# Patient Record
Sex: Female | Born: 1950 | Race: White | Hispanic: No | Marital: Married | Smoking: Current every day smoker
Health system: Southern US, Community
[De-identification: ages and names within clinical notes are randomized; demographics above are authoritative.]

## PROBLEM LIST (undated history)

## (undated) DIAGNOSIS — E7800 Pure hypercholesterolemia, unspecified: Secondary | ICD-10-CM

## (undated) DIAGNOSIS — J449 Chronic obstructive pulmonary disease, unspecified: Secondary | ICD-10-CM

## (undated) DIAGNOSIS — F4310 Post-traumatic stress disorder, unspecified: Secondary | ICD-10-CM

## (undated) DIAGNOSIS — I10 Essential (primary) hypertension: Secondary | ICD-10-CM

## (undated) DIAGNOSIS — F329 Major depressive disorder, single episode, unspecified: Secondary | ICD-10-CM

## (undated) DIAGNOSIS — E119 Type 2 diabetes mellitus without complications: Secondary | ICD-10-CM

## (undated) DIAGNOSIS — E785 Hyperlipidemia, unspecified: Secondary | ICD-10-CM

## (undated) DIAGNOSIS — D649 Anemia, unspecified: Secondary | ICD-10-CM

## (undated) DIAGNOSIS — K219 Gastro-esophageal reflux disease without esophagitis: Secondary | ICD-10-CM

## (undated) DIAGNOSIS — F32A Depression, unspecified: Secondary | ICD-10-CM

## (undated) DIAGNOSIS — M1990 Unspecified osteoarthritis, unspecified site: Secondary | ICD-10-CM

## (undated) DIAGNOSIS — I509 Heart failure, unspecified: Secondary | ICD-10-CM

## (undated) HISTORY — PX: CARPAL TUNNEL RELEASE: SHX101

## (undated) HISTORY — PX: KNEE SURGERY: SHX244

## (undated) HISTORY — DX: Type 2 diabetes mellitus without complications: E11.9

## (undated) HISTORY — DX: Post-traumatic stress disorder, unspecified: F43.10

## (undated) HISTORY — DX: Hyperlipidemia, unspecified: E78.5

## (undated) HISTORY — PX: ULNAR NERVE TRANSPOSITION: SHX2595

## (undated) HISTORY — PX: WISDOM TOOTH EXTRACTION: SHX21

---

## 1998-01-28 ENCOUNTER — Inpatient Hospital Stay (HOSPITAL_COMMUNITY): Admission: AD | Admit: 1998-01-28 | Discharge: 1998-01-31 | Payer: Self-pay | Admitting: Cardiology

## 1998-01-30 ENCOUNTER — Encounter: Payer: Self-pay | Admitting: Cardiology

## 2006-12-06 ENCOUNTER — Emergency Department (HOSPITAL_COMMUNITY): Admission: EM | Admit: 2006-12-06 | Discharge: 2006-12-06 | Payer: Self-pay | Admitting: Emergency Medicine

## 2009-09-19 ENCOUNTER — Ambulatory Visit (HOSPITAL_COMMUNITY): Admission: RE | Admit: 2009-09-19 | Discharge: 2009-09-19 | Payer: Self-pay | Admitting: Neurology

## 2010-04-21 ENCOUNTER — Encounter: Payer: Self-pay | Admitting: Neurology

## 2011-10-19 DIAGNOSIS — J4489 Other specified chronic obstructive pulmonary disease: Secondary | ICD-10-CM

## 2011-10-19 DIAGNOSIS — J449 Chronic obstructive pulmonary disease, unspecified: Secondary | ICD-10-CM

## 2015-07-18 ENCOUNTER — Encounter (HOSPITAL_COMMUNITY): Payer: Self-pay | Admitting: Emergency Medicine

## 2015-07-18 ENCOUNTER — Emergency Department (HOSPITAL_COMMUNITY): Payer: Medicaid Other

## 2015-07-18 ENCOUNTER — Inpatient Hospital Stay (HOSPITAL_COMMUNITY)
Admission: EM | Admit: 2015-07-18 | Discharge: 2015-07-20 | DRG: 812 | Disposition: A | Discharge status: Home / Self Care | Payer: Medicaid Other | Attending: Internal Medicine | Admitting: Internal Medicine

## 2015-07-18 DIAGNOSIS — F329 Major depressive disorder, single episode, unspecified: Secondary | ICD-10-CM | POA: Diagnosis present

## 2015-07-18 DIAGNOSIS — R609 Edema, unspecified: Secondary | ICD-10-CM

## 2015-07-18 DIAGNOSIS — E119 Type 2 diabetes mellitus without complications: Secondary | ICD-10-CM | POA: Diagnosis present

## 2015-07-18 DIAGNOSIS — E7800 Pure hypercholesterolemia, unspecified: Secondary | ICD-10-CM | POA: Diagnosis present

## 2015-07-18 DIAGNOSIS — I10 Essential (primary) hypertension: Secondary | ICD-10-CM | POA: Diagnosis present

## 2015-07-18 DIAGNOSIS — J441 Chronic obstructive pulmonary disease with (acute) exacerbation: Secondary | ICD-10-CM | POA: Diagnosis present

## 2015-07-18 DIAGNOSIS — Z720 Tobacco use: Secondary | ICD-10-CM | POA: Diagnosis present

## 2015-07-18 DIAGNOSIS — R0602 Shortness of breath: Secondary | ICD-10-CM | POA: Insufficient documentation

## 2015-07-18 DIAGNOSIS — D649 Anemia, unspecified: Secondary | ICD-10-CM | POA: Diagnosis present

## 2015-07-18 DIAGNOSIS — F32A Depression, unspecified: Secondary | ICD-10-CM | POA: Diagnosis present

## 2015-07-18 DIAGNOSIS — R0609 Other forms of dyspnea: Secondary | ICD-10-CM

## 2015-07-18 DIAGNOSIS — F17210 Nicotine dependence, cigarettes, uncomplicated: Secondary | ICD-10-CM | POA: Diagnosis present

## 2015-07-18 DIAGNOSIS — D581 Hereditary elliptocytosis: Principal | ICD-10-CM | POA: Diagnosis present

## 2015-07-18 DIAGNOSIS — E785 Hyperlipidemia, unspecified: Secondary | ICD-10-CM | POA: Diagnosis present

## 2015-07-18 DIAGNOSIS — E611 Iron deficiency: Secondary | ICD-10-CM | POA: Diagnosis present

## 2015-07-18 HISTORY — DX: Type 2 diabetes mellitus without complications: E11.9

## 2015-07-18 HISTORY — DX: Chronic obstructive pulmonary disease, unspecified: J44.9

## 2015-07-18 HISTORY — DX: Essential (primary) hypertension: I10

## 2015-07-18 HISTORY — DX: Heart failure, unspecified: I50.9

## 2015-07-18 HISTORY — DX: Pure hypercholesterolemia, unspecified: E78.00

## 2015-07-18 HISTORY — DX: Depression, unspecified: F32.A

## 2015-07-18 HISTORY — DX: Major depressive disorder, single episode, unspecified: F32.9

## 2015-07-18 LAB — CBC WITH DIFFERENTIAL/PLATELET
BASOS ABS: 0 10*3/uL (ref 0.0–0.1)
Basophils Relative: 0 %
Eosinophils Absolute: 0.1 10*3/uL (ref 0.0–0.7)
Eosinophils Relative: 1 %
HCT: 29.7 % — ABNORMAL LOW (ref 36.0–46.0)
HEMOGLOBIN: 7.9 g/dL — AB (ref 12.0–15.0)
LYMPHS PCT: 19 %
Lymphs Abs: 1.8 10*3/uL (ref 0.7–4.0)
MCH: 16.8 pg — ABNORMAL LOW (ref 26.0–34.0)
MCHC: 26.6 g/dL — AB (ref 30.0–36.0)
MCV: 63.1 fL — ABNORMAL LOW (ref 78.0–100.0)
Monocytes Absolute: 0.6 10*3/uL (ref 0.1–1.0)
Monocytes Relative: 7 %
NEUTROS ABS: 6.6 10*3/uL (ref 1.7–7.7)
Neutrophils Relative %: 73 %
Platelets: 273 10*3/uL (ref 150–400)
RBC: 4.71 MIL/uL (ref 3.87–5.11)
RDW: 19.7 % — ABNORMAL HIGH (ref 11.5–15.5)
SMEAR REVIEW: ADEQUATE
WBC: 9.1 10*3/uL (ref 4.0–10.5)

## 2015-07-18 LAB — BASIC METABOLIC PANEL
Anion gap: 9 (ref 5–15)
BUN: 6 mg/dL (ref 6–20)
CHLORIDE: 105 mmol/L (ref 101–111)
CO2: 27 mmol/L (ref 22–32)
CREATININE: 0.43 mg/dL — AB (ref 0.44–1.00)
Calcium: 9 mg/dL (ref 8.9–10.3)
Glucose, Bld: 137 mg/dL — ABNORMAL HIGH (ref 65–99)
POTASSIUM: 3.5 mmol/L (ref 3.5–5.1)
SODIUM: 141 mmol/L (ref 135–145)

## 2015-07-18 LAB — TROPONIN I

## 2015-07-18 LAB — BRAIN NATRIURETIC PEPTIDE: B NATRIURETIC PEPTIDE 5: 83 pg/mL (ref 0.0–100.0)

## 2015-07-18 MED ORDER — METHYLPREDNISOLONE SODIUM SUCC 125 MG IJ SOLR
125.0000 mg | Freq: Once | INTRAMUSCULAR | Status: AC
  Administered 2015-07-18: 125 mg via INTRAVENOUS
  Filled 2015-07-18: qty 2

## 2015-07-18 MED ORDER — IPRATROPIUM-ALBUTEROL 0.5-2.5 (3) MG/3ML IN SOLN
3.0000 mL | Freq: Once | RESPIRATORY_TRACT | Status: AC
  Administered 2015-07-18: 3 mL via RESPIRATORY_TRACT
  Filled 2015-07-18: qty 3

## 2015-07-18 NOTE — ED Notes (Signed)
Pt ambulated in ER and 02 stayed around 95% on room air. Resp was 28, but pt was asymptomatic.

## 2015-07-18 NOTE — ED Notes (Signed)
Pt c/o increased sob and swelling of the lower legs x one month.

## 2015-07-18 NOTE — ED Notes (Signed)
Pt with SOB and swelling to le's, pt states hx of CHF

## 2015-07-18 NOTE — ED Provider Notes (Signed)
By signing my name below, I, Linus GalasMaharshi Patel, attest that this documentation has been prepared under the direction and in the presence of Enbridge EnergyKristen N Ward, DO. Electronically Signed: Linus GalasMaharshi Patel, ED Scribe. 07/18/2015. 11:27 PM.  TIME SEEN: 11:20 PM  CHIEF COMPLAINT:  Chief Complaint  Patient presents with  . Shortness of Breath   HPI:  HPI Comments: Katherine Romero is a 65 y.o. female with history of COPD, hypertension, diabetes, hyperlipidemia and reported history of CHF who presents to the Emergency Department complaining of SOB for the past 3 weeks. Pt also reports bilateral pedal swelling and productive cough with clear to white sputum. Pt states she has not had health insurance for the past month which caused her to stop taking Lasix. She states her PCP is aware of this but did not give her a new prescription or any other medications. She states her primary care physician told her she no longer needed Lasix. She denies to me that she has ever had an echocardiogram. She is not sure how she was diagnosed with CHF. Reports also a history of CAD but no stents. Last stress test we have in our system was in 1999 with an ejection fraction of 79%. She denies any chest pain or chest discomfort. States she feels that the swelling in her legs has improved. She states she is short of breath mostly with exertion. She does not wear oxygen at home. Pt denies any other symptoms at this time.   Current smoker.  Pts PCP is Dr. Sherryll BurgerShah  ROS: See HPI Constitutional: no fever  Eyes: no drainage  ENT: no runny nose   Cardiovascular:  leg swelling, no chest pain  Resp: SOB, cough GI: no vomiting GU: no dysuria Integumentary: no rash  Allergy: no hives  Musculoskeletal: no leg swelling  Neurological: no slurred speech ROS otherwise negative  PAST MEDICAL HISTORY/PAST SURGICAL HISTORY:  Past Medical History  Diagnosis Date  . COPD (chronic obstructive pulmonary disease) (HCC)   . Hypertension   .  Diabetes mellitus without complication (HCC)   . Hypercholesteremia   . CHF (congestive heart failure) (HCC)   . Depression     MEDICATIONS:  Prior to Admission medications   Not on File    ALLERGIES:  Allergies not on file  SOCIAL HISTORY:  Social History  Substance Use Topics  . Smoking status: Current Every Day Smoker    Types: Cigarettes  . Smokeless tobacco: Not on file  . Alcohol Use: No    FAMILY HISTORY: No family history on file.  EXAM: BP 187/79 mmHg  Pulse 94  Temp(Src) 99.1 F (37.3 C) (Temporal)  Resp 24  Ht 5\' 1"  (1.549 m)  Wt 149 lb (67.586 kg)  BMI 28.17 kg/m2  SpO2 97%   CONSTITUTIONAL: Alert and oriented and responds appropriately to questions. Elderly, well-nourished, afebrile and nontoxic, oral temperature is 98.1, and no distress HEAD: Normocephalic EYES: Conjunctivae clear, PERRL ENT: normal nose; no rhinorrhea; moist mucous membranes NECK: Supple, no meningismus, no LAD  CARD: RRR; S1 and S2 appreciated; no murmurs, no clicks, no rubs, no gallops RESP: Normal chest excursion without splinting or tachypnea; breath sounds clear and equal bilaterally; no wheezes, no rhonchi, no rales, no hypoxia or respiratory distress, speaking full sentences, slightly diminished at the bases bilaterally ABD/GI: Normal bowel sounds; non-distended; soft, non-tender, no rebound, no guarding, no peritoneal signs RECTAL:  Normal rectal tone, no gross blood or melena, guaiac negative, no hemorrhoids appreciated, nontender rectal exam BACK:  The back appears normal and is non-tender to palpation, there is no CVA tenderness EXT: Normal ROM in all joints; non-tender to palpation; very mild non pitting bilateral pedal edema; normal capillary refill; no cyanosis, no calf tenderness or swelling    SKIN: Normal color for age and race; warm; no rash NEURO: Moves all extremities equally, sensation to light touch intact diffusely, cranial nerves II through XII intact PSYCH: The  patient's mood and manner are appropriate. Grooming and personal hygiene are appropriate.  MEDICAL DECISION MAKING: Patient here shortness of breath. This may be from her continued tobacco use and COPD. She is diminished at her bases. We'll give DuoNeb, steroids and reassess. We'll ambulate patient in the emergency department. She denies any chest pain. Her EKG shows no ischemic abnormality. She does not appear significantly volume overloaded.  ED PROGRESS: Patient's labs show normal troponin and normal BNP. Chest x-ray is clear. She reports feeling better after DuoNeb nebs, steroids. She was able to ambulate around the emergency department her oxygen did not drop below 95%. Hemoglobin however is 7.9. We have no previous to compare this to. She is not sure what her normal hemoglobin runs. She states she has had some black stools intermittently but normally after taking Pepto-Bismol. No bloody stool or melena now. No vomiting. No vaginal bleeding. Has had a previous colonoscopy many years ago. No history of endoscopy. He is not on anticoagulation. She is Hemoccult negative today. I feel that her anemia may be what is making her symptomatic. We'll give her 1 unit of packed red blood cells and admit patient to observation. She has never had a blood transfusion before. She is comfortable with this plan. Family also at bedside.  1:00 AM  Discussed patient's case with hospitalist, Dr. Antionette Char.  Recommend admission to telemetry, observation bed.  I will place holding orders per their request. Patient and family (if present) updated with plan. Care transferred to hospitalist service.  I reviewed all nursing notes, vitals, pertinent old records, EKGs, labs, imaging (as available).    EKG Interpretation  Date/Time:  Wednesday July 18 2015 22:28:41 EDT Ventricular Rate:  97 PR Interval:  149 QRS Duration: 104 QT Interval:  374 QTC Calculation: 475 R Axis:   96 Text Interpretation:  Sinus rhythm Low voltage  with right axis deviation Baseline wander in lead(s) III aVL aVF No significant change since last tracing in 1999 Confirmed by WARD,  DO, KRISTEN [phone]) on 07/18/2015 11:07:18 PM        CRITICAL CARE Performed by: Raelyn Number   Total critical care time: 35 minutes - symptomatic anemia requiring blood transfusion  Critical care time was exclusive of separately billable procedures and treating other patients.  Critical care was necessary to treat or prevent imminent or life-threatening deterioration.  Critical care was time spent personally by me on the following activities: development of treatment plan with patient and/or surrogate as well as nursing, discussions with consultants, evaluation of patient's response to treatment, examination of patient, obtaining history from patient or surrogate, ordering and performing treatments and interventions, ordering and review of laboratory studies, ordering and review of radiographic studies, pulse oximetry and re-evaluation of patient's condition.    I personally performed the services described in this documentation, which was scribed in my presence. The recorded information has been reviewed and is accurate.    Layla Maw Ward, DO 07/19/15 0157

## 2015-07-19 ENCOUNTER — Encounter (HOSPITAL_COMMUNITY): Payer: Self-pay | Admitting: Family Medicine

## 2015-07-19 DIAGNOSIS — D649 Anemia, unspecified: Secondary | ICD-10-CM | POA: Diagnosis present

## 2015-07-19 DIAGNOSIS — J441 Chronic obstructive pulmonary disease with (acute) exacerbation: Secondary | ICD-10-CM | POA: Diagnosis present

## 2015-07-19 DIAGNOSIS — Z720 Tobacco use: Secondary | ICD-10-CM

## 2015-07-19 DIAGNOSIS — R0609 Other forms of dyspnea: Secondary | ICD-10-CM

## 2015-07-19 DIAGNOSIS — D581 Hereditary elliptocytosis: Secondary | ICD-10-CM

## 2015-07-19 DIAGNOSIS — R0600 Dyspnea, unspecified: Secondary | ICD-10-CM | POA: Diagnosis present

## 2015-07-19 DIAGNOSIS — F17210 Nicotine dependence, cigarettes, uncomplicated: Secondary | ICD-10-CM | POA: Diagnosis present

## 2015-07-19 DIAGNOSIS — F329 Major depressive disorder, single episode, unspecified: Secondary | ICD-10-CM | POA: Diagnosis present

## 2015-07-19 DIAGNOSIS — E785 Hyperlipidemia, unspecified: Secondary | ICD-10-CM | POA: Diagnosis present

## 2015-07-19 DIAGNOSIS — I10 Essential (primary) hypertension: Secondary | ICD-10-CM | POA: Diagnosis present

## 2015-07-19 DIAGNOSIS — F32A Depression, unspecified: Secondary | ICD-10-CM | POA: Diagnosis present

## 2015-07-19 DIAGNOSIS — E7800 Pure hypercholesterolemia, unspecified: Secondary | ICD-10-CM | POA: Diagnosis present

## 2015-07-19 DIAGNOSIS — E119 Type 2 diabetes mellitus without complications: Secondary | ICD-10-CM | POA: Diagnosis present

## 2015-07-19 DIAGNOSIS — E611 Iron deficiency: Secondary | ICD-10-CM | POA: Diagnosis present

## 2015-07-19 HISTORY — DX: Hereditary elliptocytosis: D58.1

## 2015-07-19 HISTORY — DX: Tobacco use: Z72.0

## 2015-07-19 HISTORY — DX: Chronic obstructive pulmonary disease with (acute) exacerbation: J44.1

## 2015-07-19 LAB — FERRITIN: Ferritin: 4 ng/mL — ABNORMAL LOW (ref 11–307)

## 2015-07-19 LAB — CBC WITH DIFFERENTIAL/PLATELET
BASOS ABS: 0 10*3/uL (ref 0.0–0.1)
Basophils Relative: 0 %
EOS PCT: 0 %
Eosinophils Absolute: 0 10*3/uL (ref 0.0–0.7)
HCT: 25.2 % — ABNORMAL LOW (ref 36.0–46.0)
HEMOGLOBIN: 6.5 g/dL — AB (ref 12.0–15.0)
LYMPHS PCT: 6 %
Lymphs Abs: 0.5 10*3/uL — ABNORMAL LOW (ref 0.7–4.0)
MCH: 16.3 pg — ABNORMAL LOW (ref 26.0–34.0)
MCHC: 25.8 g/dL — ABNORMAL LOW (ref 30.0–36.0)
MCV: 63.2 fL — AB (ref 78.0–100.0)
Monocytes Absolute: 0 10*3/uL — ABNORMAL LOW (ref 0.1–1.0)
Monocytes Relative: 0 %
NEUTROS PCT: 94 %
Neutro Abs: 8.6 10*3/uL — ABNORMAL HIGH (ref 1.7–7.7)
PLATELETS: 286 10*3/uL (ref 150–400)
RBC: 3.99 MIL/uL (ref 3.87–5.11)
RDW: 19.6 % — ABNORMAL HIGH (ref 11.5–15.5)
WBC: 9.2 10*3/uL (ref 4.0–10.5)

## 2015-07-19 LAB — BILIRUBIN, FRACTIONATED(TOT/DIR/INDIR): Total Bilirubin: 0.3 mg/dL (ref 0.3–1.2)

## 2015-07-19 LAB — CBC
HEMATOCRIT: 28.4 % — AB (ref 36.0–46.0)
Hemoglobin: 7 g/dL — ABNORMAL LOW (ref 12.0–15.0)
MCH: 16.4 pg — ABNORMAL LOW (ref 26.0–34.0)
MCHC: 24.6 g/dL — ABNORMAL LOW (ref 30.0–36.0)
MCV: 66.7 fL — AB (ref 78.0–100.0)
Platelets: 350 10*3/uL (ref 150–400)
RBC: 4.26 MIL/uL (ref 3.87–5.11)
RDW: 20.4 % — AB (ref 11.5–15.5)
WBC: 11 10*3/uL — AB (ref 4.0–10.5)

## 2015-07-19 LAB — BASIC METABOLIC PANEL
ANION GAP: 10 (ref 5–15)
BUN: 8 mg/dL (ref 6–20)
CALCIUM: 9.1 mg/dL (ref 8.9–10.3)
CO2: 25 mmol/L (ref 22–32)
Chloride: 105 mmol/L (ref 101–111)
Creatinine, Ser: 0.51 mg/dL (ref 0.44–1.00)
GFR calc Af Amer: >60 mL/min (ref >60)
GFR calc non Af Amer: >60 mL/min (ref >60)
GLUCOSE: 189 mg/dL — AB (ref 65–99)
Potassium: 3.6 mmol/L (ref 3.5–5.1)
Sodium: 140 mmol/L (ref 135–145)

## 2015-07-19 LAB — URINALYSIS, ROUTINE W REFLEX MICROSCOPIC
BILIRUBIN URINE: NEGATIVE
GLUCOSE, UA: 100 mg/dL — AB
HGB URINE DIPSTICK: NEGATIVE
Ketones, ur: NEGATIVE mg/dL
Leukocytes, UA: NEGATIVE
Nitrite: NEGATIVE
PH: 6.5 (ref 5.0–8.0)
Protein, ur: NEGATIVE mg/dL

## 2015-07-19 LAB — IRON AND TIBC
Iron: 21 ug/dL — ABNORMAL LOW (ref 28–170)
Saturation Ratios: 4 % — ABNORMAL LOW (ref 10.4–31.8)
TIBC: 549 ug/dL — AB (ref 250–450)
UIBC: 528 ug/dL

## 2015-07-19 LAB — POC OCCULT BLOOD, ED: FECAL OCCULT BLD: NEGATIVE

## 2015-07-19 LAB — PREPARE RBC (CROSSMATCH)

## 2015-07-19 LAB — GLUCOSE, CAPILLARY
Glucose-Capillary: 275 mg/dL — ABNORMAL HIGH (ref 65–99)
Glucose-Capillary: 262 mg/dL — ABNORMAL HIGH (ref 65–99)
Glucose-Capillary: 407 mg/dL — ABNORMAL HIGH (ref 65–99)

## 2015-07-19 LAB — MAGNESIUM: Magnesium: 1.9 mg/dL (ref 1.7–2.4)

## 2015-07-19 LAB — VITAMIN B12: Vitamin B-12: 243 pg/mL (ref 180–914)

## 2015-07-19 LAB — PROTIME-INR
INR: 1.07 (ref 0.00–1.49)
Prothrombin Time: 14.1 seconds (ref 11.6–15.2)

## 2015-07-19 LAB — LACTATE DEHYDROGENASE: LDH: 190 U/L (ref 98–192)

## 2015-07-19 LAB — ABO/RH: ABO/RH(D): O NEG

## 2015-07-19 LAB — APTT: APTT: 30 s (ref 24–37)

## 2015-07-19 MED ORDER — PROMETHAZINE-CODEINE 6.25-10 MG/5ML PO SYRP
5.0000 mL | ORAL_SOLUTION | ORAL | Status: DC | PRN
  Administered 2015-07-19: 5 mL via ORAL
  Filled 2015-07-19: qty 5

## 2015-07-19 MED ORDER — INSULIN ASPART 100 UNIT/ML ~~LOC~~ SOLN
0.0000 [IU] | Freq: Three times a day (TID) | SUBCUTANEOUS | Status: DC
  Administered 2015-07-19 (×3): 5 [IU] via SUBCUTANEOUS
  Administered 2015-07-20: 3 [IU] via SUBCUTANEOUS
  Administered 2015-07-20: 7 [IU] via SUBCUTANEOUS

## 2015-07-19 MED ORDER — HYDROCODONE-ACETAMINOPHEN 5-325 MG PO TABS
1.0000 | ORAL_TABLET | ORAL | Status: DC | PRN
  Administered 2015-07-19: 1 via ORAL
  Filled 2015-07-19: qty 1

## 2015-07-19 MED ORDER — ONDANSETRON HCL 4 MG PO TABS: 4.0000 mg | ORAL_TABLET | Freq: Four times a day (QID) | ORAL | Status: DC | PRN

## 2015-07-19 MED ORDER — METHYLPREDNISOLONE SODIUM SUCC 125 MG IJ SOLR
60.0000 mg | Freq: Four times a day (QID) | INTRAMUSCULAR | Status: DC
  Administered 2015-07-19 – 2015-07-20 (×6): 60 mg via INTRAVENOUS
  Filled 2015-07-19 (×6): qty 2

## 2015-07-19 MED ORDER — ACETAMINOPHEN 325 MG PO TABS: 650.0000 mg | ORAL_TABLET | Freq: Four times a day (QID) | ORAL | Status: DC | PRN

## 2015-07-19 MED ORDER — SODIUM CHLORIDE 0.9 % IV SOLN
10.0000 mL/h | Freq: Once | INTRAVENOUS | Status: AC
  Administered 2015-07-19: 10 mL/h via INTRAVENOUS

## 2015-07-19 MED ORDER — SENNOSIDES-DOCUSATE SODIUM 8.6-50 MG PO TABS
1.0000 | ORAL_TABLET | Freq: Every day | ORAL | Status: DC
  Administered 2015-07-19: 1 via ORAL
  Filled 2015-07-19: qty 1

## 2015-07-19 MED ORDER — POTASSIUM CHLORIDE 10 MEQ/100ML IV SOLN
10.0000 meq | INTRAVENOUS | Status: AC
  Administered 2015-07-19 (×2): 10 meq via INTRAVENOUS
  Filled 2015-07-19 (×2): qty 100

## 2015-07-19 MED ORDER — NICOTINE 21 MG/24HR TD PT24
21.0000 mg | MEDICATED_PATCH | Freq: Every day | TRANSDERMAL | Status: DC
  Administered 2015-07-19 – 2015-07-20 (×2): 21 mg via TRANSDERMAL
  Filled 2015-07-19 (×2): qty 1

## 2015-07-19 MED ORDER — ONDANSETRON HCL 4 MG/2ML IJ SOLN: 4.0000 mg | Freq: Four times a day (QID) | INTRAMUSCULAR | Status: DC | PRN

## 2015-07-19 MED ORDER — IPRATROPIUM-ALBUTEROL 0.5-2.5 (3) MG/3ML IN SOLN: 3.0000 mL | RESPIRATORY_TRACT | Status: DC | PRN

## 2015-07-19 MED ORDER — INSULIN ASPART 100 UNIT/ML ~~LOC~~ SOLN
10.0000 [IU] | Freq: Once | SUBCUTANEOUS | Status: AC
  Administered 2015-07-19: 10 [IU] via SUBCUTANEOUS

## 2015-07-19 MED ORDER — SODIUM CHLORIDE 0.9% FLUSH
3.0000 mL | Freq: Two times a day (BID) | INTRAVENOUS | Status: DC
  Administered 2015-07-19 – 2015-07-20 (×4): 3 mL via INTRAVENOUS

## 2015-07-19 MED ORDER — HYDRALAZINE HCL 20 MG/ML IJ SOLN: 10.0000 mg | INTRAMUSCULAR | Status: DC | PRN

## 2015-07-19 MED ORDER — SODIUM CHLORIDE 0.9 % IV SOLN
Freq: Once | INTRAVENOUS | Status: AC
  Administered 2015-07-19: 02:00:00 via INTRAVENOUS

## 2015-07-19 MED ORDER — ACETAMINOPHEN 650 MG RE SUPP: 650.0000 mg | Freq: Four times a day (QID) | RECTAL | Status: DC | PRN

## 2015-07-19 NOTE — Progress Notes (Signed)
CRITICAL VALUE ALERT  Critical value received:  HgB = 6.5  Date of notification:  07/19/15  Time of notification:  0740  Critical value read back:Yes.    Nurse who received alert:  Antony OdeaJason Alden Bensinger  MD notified (1st page):  Dr. Roda ShuttersXU  Time of first page:  [phone]  MD notified (2nd page):  Time of second page:  Responding MD:    Time MD responded:

## 2015-07-19 NOTE — Progress Notes (Signed)
Pt had episode of SVT (200).  EKG taken showing SR. O2 @ 2L given.  Vitals B/P155/75, T97.5, 96%, 91P. Pt stated that the frequent coughing spells she had been experiencing made her heart race and made her SOB.  Md notified and ordered K+,  also cough med to suppress coughing.

## 2015-07-19 NOTE — Progress Notes (Addendum)
One unit of blood transfused, vital stable, repeat cbc/bmp pending, no sign of bleeding, anemia work up showed iron deficiency.  C/p right lower extremity edema, venous us ordered.

## 2015-07-19 NOTE — H&P (Signed)
History and Physical    Katherine Romero ZOX:[id number] DOB: [birth year] DOA: 07/18/2015  Referring Provider: Dr. Elesa Massed (EDP) PCP: Kirstie Peri, MD  Outpatient Specialists: None listed  Patient coming from: Home  Chief Complaint: Dyspnea  HPI: Katherine Romero is a 65 y.o. female with medical history significant for COPD, tobacco abuse, type 2 diabetes mellitus, and hypertension who presents to the ED with approximately 1 month of exertional dyspnea. Patient had previously been managed with Lasix, but this was discontinued approximately one month ago by her PCP who indicated that she does not need it. Patient has suspected that her progressive dyspnea over the past month his aunt results of the discontinued diuretic and likely pulmonary edema. She denies any fevers or chills over the past month and denies chest pain, palpitations, orthopnea, or PND. She has increased cough over the past month occasionally productive of thick white sputum. She continues to smoke cigarettes daily. She occasionally uses her home nebulizer unit which does help somewhat with the current symptoms. Patient denies abdominal pain, nausea, vomiting, or diarrhea. She denies melena or hematochezia. Patient also denies hematuria or vaginal bleeding.  ED Course: Upon arrival to the ED, patient is found to be afebrile, saturating adequately on room air while at rest, and with vital signs otherwise stable. Chest x-ray is notable for mild cardiac enlargement but negative for pulmonary edema or infiltrate. EKG features a sinus rhythm with low voltage QRS but no ischemic features. BNP is within the normal limits and troponin is undetectable. BMP is largely unremarkable and CBC is notable for hemoglobin of 7.9 with MCV of 63.1. Elliptocytes are noted on the CBC. Patient was treated with DuoNeb and Solu-Medrol 125 mg IV in the emergency department and enjoyed some improvement in her respiratory symptoms. She was ambulated about the emergency  department and, so saturating well, was significantly symptomatic with dyspnea. 1 unit of packed red blood cells has been ordered by the EDP for immediate transfusion. Patient will be admitted to the hospital for ongoing evaluation and management of progressive dyspnea suspected secondary to anemia and COPD with acute exacerbation.  Review of Systems:  All other systems reviewed and apart from HPI, are negative.  Past Medical History  Diagnosis Date  . COPD (chronic obstructive pulmonary disease) (HCC)   . Hypertension   . Diabetes mellitus without complication (HCC)   . Hypercholesteremia   . CHF (congestive heart failure) (HCC)   . Depression     Past Surgical History  Procedure Laterality Date  . Knee surgery    . Carpal tunnel release       reports that she has been smoking Cigarettes.  She does not have any smokeless tobacco history on file. She reports that she does not drink alcohol or use illicit drugs.  Allergies not on file  Family History  Problem Relation Age of Onset  . Anemia Son      Prior to Admission medications   Not on File    Physical Exam: Filed Vitals:   07/18/15 2330 07/18/15 2334 07/18/15 2345 07/19/15 0048  BP: 152/82   183/81  Pulse:   91 101  Temp:      TempSrc:      Resp: Height:      Weight:      SpO2:  98% 99% 97%      Constitutional: NAD, calm, comfortable Eyes: PERTLA, lids and conjunctivae normal ENMT: Mucous membranes are moist. Posterior pharynx clear of any exudate  or lesions. Normal dentition.  Neck: normal, supple, no masses, no thyromegaly Respiratory: clear to auscultation bilaterally, mildly diminished with scattered expiratory wheezes bilaterally, no crackles. Normal respiratory effort.    Cardiovascular: S1 & S2 heard, regular rate and rhythm, no murmurs / rubs / gallops. No extremity edema. 2+ pedal pulses.   Abdomen: No distension, no tenderness, no masses palpated. No hepatosplenomegaly. Bowel sounds  normal.  Musculoskeletal: no clubbing / cyanosis. No joint deformity upper and lower extremities. Good ROM, no contractures. Normal muscle tone.  Skin: no rashes, lesions, ulcers. No induration Neurologic: CN 2-12 grossly intact. Sensation intact, DTR normal. Strength 5/5 in all 4 limbs.  Psychiatric: Normal judgment and insight. Alert and oriented x 3. Normal mood.     Labs on Admission: I have personally reviewed following labs and imaging studies  CBC:  Recent Labs Lab 07/18/15 2330  WBC 9.1  NEUTROABS 6.6  HGB 7.9*  HCT 29.7*  MCV 63.1*  PLT 273   Basic Metabolic Panel:  Recent Labs Lab 07/18/15 2230  NA 141  K 3.5  CL 105  CO2 27  GLUCOSE 137*  BUN 6  CREATININE 0.43*  CALCIUM 9.0   GFR: Estimated Creatinine Clearance: 63.3 mL/min (by C-G formula based on Cr of 0.43). Liver Function Tests: No results for input(s): AST, ALT, ALKPHOS, BILITOT, PROT, ALBUMIN in the last 168 hours. No results for input(s): LIPASE, AMYLASE in the last 168 hours. No results for input(s): AMMONIA in the last 168 hours. Coagulation Profile:  Recent Labs Lab 07/18/15 2233  INR 1.07   Cardiac Enzymes:  Recent Labs Lab 07/18/15 2230  TROPONINI <0.03   BNP (last 3 results) No results for input(s): PROBNP in the last 8760 hours. HbA1C: No results for input(s): HGBA1C in the last 72 hours. CBG: No results for input(s): GLUCAP in the last 168 hours. Lipid Profile: No results for input(s): CHOL, HDL, LDLCALC, TRIG, CHOLHDL, LDLDIRECT in the last 72 hours. Thyroid Function Tests: No results for input(s): TSH, T4TOTAL, FREET4, T3FREE, THYROIDAB in the last 72 hours. Anemia Panel: No results for input(s): VITAMINB12, FOLATE, FERRITIN, TIBC, IRON, RETICCTPCT in the last 72 hours. Urine analysis: No results found for: COLORURINE, APPEARANCEUR, LABSPEC, PHURINE, GLUCOSEU, HGBUR, BILIRUBINUR, KETONESUR, PROTEINUR, UROBILINOGEN, NITRITE, LEUKOCYTESUR Sepsis  Labs: (procalcitonin:4,lacticidven:4) )No results found for this or any previous visit (from the past 240 hour(s)).   Radiological Exams on Admission: Dg Chest Portable 1 View  07/18/2015  CLINICAL DATA:  Shortness of breath for 1 week, worse tonight. Central chest pain and pain between the shoulder blades, onset today. Chronic cough. History of CHF, diabetes, hypertension, COPD. EXAM: PORTABLE CHEST 1 VIEW COMPARISON:  11/15/2014 FINDINGS: Mild cardiac enlargement. No pulmonary vascular congestion. No focal airspace disease or consolidation in the lungs. No blunting of costophrenic angles. No pneumothorax. Mediastinal contours appear intact. IMPRESSION: Mild cardiac enlargement.  No evidence of active pulmonary disease. Electronically Signed   By: Burman Nieves M.D.   On: 07/18/2015 22:46    EKG: Independently reviewed. Sinus rhythm, low-voltage QRS  Assessment/Plan  1. 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 COPD with acute exacerbation  - No supplemental O2 requirement  - Responding well to DuoNeb and steroid in the ED - Continue DuoNeb q4h prn, Solu-Medrol 60 mg IV q6h  - Monitor O2 sats and titrate FiO2 to keep sat >92%   3. Tobacco abuse - Counseled toward cessation  - RN to provide smoking cessation information prior to discharge     DVT prophylaxis: SCDs  Code Status: Full  Family Communication: Husband at the beside  Disposition Plan: Admit to telemetry  Consults called: None   Admission status: Observation    Briscoe Deutscherimothy S Rolin Schult MD [facility] Pager [phone]  If 7PM-7AM, please contact night-coverage [email or web address] Password [email or web address]  07/19/2015, 1:26 AM

## 2015-07-20 ENCOUNTER — Inpatient Hospital Stay (HOSPITAL_COMMUNITY): Payer: Medicaid Other

## 2015-07-20 DIAGNOSIS — Z720 Tobacco use: Secondary | ICD-10-CM

## 2015-07-20 DIAGNOSIS — J441 Chronic obstructive pulmonary disease with (acute) exacerbation: Secondary | ICD-10-CM

## 2015-07-20 DIAGNOSIS — D509 Iron deficiency anemia, unspecified: Secondary | ICD-10-CM

## 2015-07-20 DIAGNOSIS — D581 Hereditary elliptocytosis: Principal | ICD-10-CM

## 2015-07-20 LAB — CBC
HEMATOCRIT: 29.7 % — AB (ref 36.0–46.0)
HEMOGLOBIN: 8.2 g/dL — AB (ref 12.0–15.0)
MCH: 17.7 pg — ABNORMAL LOW (ref 26.0–34.0)
MCHC: 27.6 g/dL — ABNORMAL LOW (ref 30.0–36.0)
MCV: 64.1 fL — ABNORMAL LOW (ref 78.0–100.0)
Platelets: 386 10*3/uL (ref 150–400)
RBC: 4.63 MIL/uL (ref 3.87–5.11)
RDW: 21.1 % — ABNORMAL HIGH (ref 11.5–15.5)
WBC: 14 10*3/uL — AB (ref 4.0–10.5)

## 2015-07-20 LAB — BASIC METABOLIC PANEL
ANION GAP: 9 (ref 5–15)
BUN: 10 mg/dL (ref 6–20)
CHLORIDE: 105 mmol/L (ref 101–111)
CO2: 25 mmol/L (ref 22–32)
Calcium: 9.3 mg/dL (ref 8.9–10.3)
Creatinine, Ser: 0.5 mg/dL (ref 0.44–1.00)
GFR calc non Af Amer: >60 mL/min (ref >60)
Glucose, Bld: 230 mg/dL — ABNORMAL HIGH (ref 65–99)
POTASSIUM: 3.7 mmol/L (ref 3.5–5.1)
Sodium: 139 mmol/L (ref 135–145)

## 2015-07-20 LAB — GLUCOSE, CAPILLARY
GLUCOSE-CAPILLARY: 305 mg/dL — AB (ref 65–99)
Glucose-Capillary: 236 mg/dL — ABNORMAL HIGH (ref 65–99)

## 2015-07-20 LAB — FOLATE RBC
FOLATE, HEMOLYSATE: 321.7 ng/mL
Folate, RBC: 1324 ng/mL (ref >498)
HEMATOCRIT: 24.3 % — AB (ref 34.0–46.6)

## 2015-07-20 LAB — TYPE AND SCREEN
ABO/RH(D): O NEG
Antibody Screen: POSITIVE
DAT, IgG: NEGATIVE
Unit division: 0

## 2015-07-20 LAB — HEMOGLOBIN A1C
HEMOGLOBIN A1C: 6.3 % — AB (ref 4.8–5.6)
MEAN PLASMA GLUCOSE: 134 mg/dL

## 2015-07-20 LAB — HAPTOGLOBIN: Haptoglobin: 202 mg/dL — ABNORMAL HIGH (ref 34–200)

## 2015-07-20 MED ORDER — DOXYCYCLINE HYCLATE 100 MG PO CAPS: 100.0000 mg | ORAL_CAPSULE | Freq: Two times a day (BID) | ORAL | Status: DC

## 2015-07-20 MED ORDER — FERROUS SULFATE 325 (65 FE) MG PO TABS
325.0000 mg | ORAL_TABLET | ORAL | Status: DC
  Administered 2015-07-20: 325 mg via ORAL
  Filled 2015-07-20: qty 1

## 2015-07-20 MED ORDER — FERROUS SULFATE 325 (65 FE) MG PO TABS: 325.0000 mg | ORAL_TABLET | ORAL | Status: DC

## 2015-07-20 MED ORDER — SENNOSIDES-DOCUSATE SODIUM 8.6-50 MG PO TABS: 1.0000 | ORAL_TABLET | Freq: Every day | ORAL | Status: DC

## 2015-07-20 MED ORDER — PREDNISONE 10 MG PO TABS: ORAL_TABLET | ORAL | Status: DC

## 2015-07-20 NOTE — Care Management Note (Signed)
Case Management Note  Patient Details  Name: Katherine Romero MRN: [id number] Date of Birth: June 20, 1950  Subjective/Objective:   Spoke with patient for discharge planning. Patient is alert and orient from home , denies any needs. Stated that she can afford her medications without difficulty. No Cm needs identified.            Action/Plan:Home with self care.   Expected Discharge Date:  07/21/15               Expected Discharge Plan:  Home/Self Care  In-House Referral:     Discharge planning Services  CM Consult  Post Acute Care Choice:    Choice offered to:  NA  DME Arranged:  N/A DME Agency:  NA  HH Arranged:  NA HH Agency:     Status of Service:  Completed, signed off  Medicare Important Message Given:    Date Medicare IM Given:    Medicare IM give by:    Date Additional Medicare IM Given:    Additional Medicare Important Message give by:     If discussed at Long Length of Stay Meetings, dates discussed:    Additional Comments:  Adonis HugueninBerkhead, Aanya Haynes L, RN 07/20/2015, 12:14 PM

## 2015-07-20 NOTE — Progress Notes (Addendum)
Results for Joylene IgoUSTIN, Josalyn R (MRN [id number]) as of 07/20/2015 09:57  Ref. Range 07/19/2015 11:00 07/19/2015 16:45 07/19/2015 21:34 07/20/2015 08:02  Glucose-Capillary Latest Ref Range: 65-99 mg/dL 409275 (H) 811262 (H) 914407 (H) 236 (H)  Noted that CBGs have been greater than 180 mg/dl.  Recommend adding Novolog 3-4 units TID as meal coverage and continue Novolog SENSITIVE correction scale TID & HS if blood sugars continue to be elevated and while on steroids. Recommed changing to diet to CHO Modified Medium diet. Will continue to follow while in hospital. Smith MinceKendra Haleemah Buckalew RN BSN CDE

## 2015-07-20 NOTE — Progress Notes (Signed)
Discharged PT per MD order and protocol. Discharge handouts reviewed/explained. Education completed.  Pt verbalized understanding and left with all belongings. VSS. IV catheter D/C.  Patient wheeled down by staff member.  

## 2015-07-20 NOTE — Discharge Summary (Addendum)
Discharge Summary  Katherine Romero:[id number] DOB: 1950/09/12  PCP: Katherine Peri, MD  Admit date: 07/18/2015 Discharge date: 07/20/2015  Time spent: <73mins  Recommendations for Outpatient Follow-up:  1. F/u with PMD within a week  for hospital discharge follow up, repeat cbc/bmp at follow up 2. F/u with hematology in one week for anemia  Discharge Diagnoses:  Active Hospital Problems   Diagnosis Date Noted  . Symptomatic anemia 07/19/2015  . COPD with acute exacerbation (HCC) 07/19/2015  . Depression 07/19/2015  . Tobacco abuse 07/19/2015  . Elliptocytosis (HCC) 07/19/2015  . DOE (dyspnea on exertion)     Resolved Hospital Problems   Diagnosis Date Noted Date Resolved  No resolved problems to display.    Discharge Condition: stable  Diet recommendation: heart healthy/carb modified  Filed Weights   07/18/15 2220 07/19/15 0207 07/20/15 0532  Weight: 67.586 kg (149 lb) 67.4 kg (148 lb 9.4 oz) 66.089 kg (145 lb 11.2 oz)    History of present illness:  Katherine Romero is a 65 y.o. female with medical history significant for COPD, tobacco abuse, type 2 diabetes mellitus, and hypertension who presents to the ED with approximately 1 month of exertional dyspnea. Patient had previously been managed with Lasix, but this was discontinued approximately one month ago by her PCP who indicated that she does not need it. Patient has suspected that her progressive dyspnea over the past month his aunt results of the discontinued diuretic and likely pulmonary edema. She denies any fevers or chills over the past month and denies chest pain, palpitations, orthopnea, or PND. She has increased cough over the past month occasionally productive of thick white sputum. She continues to smoke cigarettes daily. She occasionally uses her home nebulizer unit which does help somewhat with the current symptoms. Patient denies abdominal pain, nausea, vomiting, or diarrhea. She denies melena or  hematochezia. Patient also denies hematuria or vaginal bleeding.  ED Course: Upon arrival to the ED, patient is found to be afebrile, saturating adequately on room air while at rest, and with vital signs otherwise stable. Chest x-ray is notable for mild cardiac enlargement but negative for pulmonary edema or infiltrate. EKG features a sinus rhythm with low voltage QRS but no ischemic features. BNP is within the normal limits and troponin is undetectable. BMP is largely unremarkable and CBC is notable for hemoglobin of 7.9 with MCV of 63.1. Elliptocytes are noted on the CBC. Patient was treated with DuoNeb and Solu-Medrol 125 mg IV in the emergency department and enjoyed some improvement in her respiratory symptoms. She was ambulated about the emergency department and, so saturating well, was significantly symptomatic with dyspnea. 1 unit of packed red blood cells has been ordered by the EDP for immediate transfusion. Patient will be admitted to the hospital for ongoing evaluation and management of progressive dyspnea suspected secondary to anemia and COPD with acute exacerbation.  Hospital Course:  Principal Problem:   Symptomatic anemia Active Problems:   COPD with acute exacerbation (HCC)   Depression   Tobacco abuse   Elliptocytosis (HCC)   DOE (dyspnea on exertion)  1. Symptomatic anemia, elliptocytosis  - Hgb 7.9, MCV 63 , with elliptocytes on admission with no prior for comparison  - DRE is normal; no sign of active blood loss  - Pt denies known history of anemia; has never had transfusion  - FHx notable for anemia in her son, requiring transfusion  - s/p 1 unit pRBCs , hgb improved after blood transfusion, INR wnl - Hemolysis  panel negative with normal LDH, tbili wnl. b12 wnl, folate pending, + iron deficiency, started iron supplement,  -outpatient hematology follow up  2. COPD with acute exacerbation  - No supplemental O2 requirement  - Responding well to DuoNeb and steroid  in the ED - Continue DuoNeb q4h prn, Solu-Medrol 60 mg IV q6h  - cxr no acute findings, wheeze has resolved, discharged home with doxycycline and prednisone short taper  3. HTN; stable on home meds   4. Right lower extremity edema: negative venous US, resolved at discharge.  5. Tobacco abuse - Counseled toward cessation  - RN to provide smoking cessation information prior to discharge    DVT prophylaxis: SCDs  Code Status: Full  Family Communication: Husband at the beside  Disposition Plan: home  Procedures:  none  Consultations:  none  Discharge Exam: BP 151/78 mmHg  Pulse 89  Temp(Src) 97.8 F (36.6 C) (Oral)  Resp 20  Ht 5\' 1"  (1.549 m)  Wt 66.089 kg (145 lb 11.2 oz)  BMI 27.54 kg/m2  SpO2 96%  General: aaox3 Cardiovascular: RRR Respiratory: CTABL Ab: soft, NT/ND, positive BS Extremity: right lower extremity edema subsided,  Discharge Instructions You were cared for by a hospitalist during your hospital stay. If you have any questions about your discharge medications or the care you received while you were in the hospital after you are discharged, you can call the unit and asked to speak with the hospitalist on call if the hospitalist that took care of you is not available. Once you are discharged, your primary care physician will handle any further medical issues. Please note that NO REFILLS for any discharge medications will be authorized once you are discharged, as it is imperative that you return to your primary care physician (or establish a relationship with a primary care physician if you do not have one) for your aftercare needs so that they can reassess your need for medications and monitor your lab values.      Discharge Instructions    Diet - low sodium heart healthy    Complete by:  As directed      Increase activity slowly    Complete by:  As directed             Medication List    TAKE these medications        albuterol (2.5 MG/3ML)  0.083% nebulizer solution  Commonly known as:  PROVENTIL  INHALE ONE VIAL IN NEBULIZER FOUR TIMES DAILY     amLODipine 5 MG tablet  Commonly known as:  NORVASC  Take 5 mg by mouth daily.     atorvastatin 40 MG tablet  Commonly known as:  LIPITOR  Take 40 mg by mouth daily.     doxycycline 100 MG capsule  Commonly known as:  VIBRAMYCIN  Take 1 capsule (100 mg total) by mouth 2 (two) times daily.     ferrous sulfate 325 (65 FE) MG tablet  Take 1 tablet (325 mg total) by mouth every other day.     lisinopril 40 MG tablet  Commonly known as:  PRINIVIL,ZESTRIL  Take 40 mg by mouth daily.     metoprolol 100 MG tablet  Commonly known as:  LOPRESSOR  Take 100 mg by mouth 2 (two) times daily.     predniSONE 10 MG tablet  Commonly known as:  DELTASONE  Take 5 tabs on day one, take 4 tabs on day two, take 3 tabs on day three, take 2tabs on day  four, take one tab on day five, then stop.     senna-docusate 8.6-50 MG tablet  Commonly known as:  Senokot-S  Take 1 tablet by mouth at bedtime.       No Known Allergies Follow-up Information    Follow up with Arvil Chaco, MD In 1 week.   Specialties:  Hematology and Oncology, Oncology   Why:  anemia with elliptocytosis   Contact information:   [address] [place] [address] [phone]       Follow up with [facility], MD In 1 week.   Specialty:  Internal Medicine   Why:  hospital discharge follow up, repeat cbc/bmp at follow up   Contact information:   [address] [place] [address] [phone]        The results of significant diagnostics from this hospitalization (including imaging, microbiology, ancillary and laboratory) are listed below for reference.    Significant Diagnostic Studies: US Venous Img Lower Unilateral Right  07/20/2015  CLINICAL DATA:  Right lower extremity edema and shortness of breath. History of COPD. History of smoking. EXAM: RIGHT LOWER EXTREMITY VENOUS DOPPLER ULTRASOUND TECHNIQUE:  Gray-scale sonography with graded compression, as well as color Doppler and duplex ultrasound were performed to evaluate the lower extremity deep venous systems from the level of the common femoral vein and including the common femoral, femoral, profunda femoral, popliteal and calf veins including the posterior tibial, peroneal and gastrocnemius veins when visible. The superficial great saphenous vein was also interrogated. Spectral Doppler was utilized to evaluate flow at rest and with distal augmentation maneuvers in the common femoral, femoral and popliteal veins. COMPARISON:  None. FINDINGS: Contralateral Common Femoral Vein: Respiratory phasicity is normal and symmetric with the symptomatic side. No evidence of thrombus. Normal compressibility. Common Femoral Vein: No evidence of thrombus. Normal compressibility, respiratory phasicity and response to augmentation. Saphenofemoral Junction: No evidence of thrombus. Normal compressibility and flow on color Doppler imaging. Profunda Femoral Vein: No evidence of thrombus. Normal compressibility and flow on color Doppler imaging. Femoral Vein: No evidence of thrombus. Normal compressibility, respiratory phasicity and response to augmentation. Popliteal Vein: No evidence of thrombus. Normal compressibility, respiratory phasicity and response to augmentation. Calf Veins: No evidence of thrombus. Normal compressibility and flow on color Doppler imaging. Superficial Great Saphenous Vein: No evidence of thrombus. Normal compressibility and flow on color Doppler imaging. Venous Reflux:  None. Other Findings:  None. IMPRESSION: No evidence of DVT within the right lower extremity. Electronically Signed   By: Simonne Come M.D.   On: 07/20/2015 09:20   Dg Chest Portable 1 View  07/18/2015  CLINICAL DATA:  Shortness of breath for 1 week, worse tonight. Central chest pain and pain between the shoulder blades, onset today. Chronic cough. History of CHF, diabetes, hypertension,  COPD. EXAM: PORTABLE CHEST 1 VIEW COMPARISON:  11/15/2014 FINDINGS: Mild cardiac enlargement. No pulmonary vascular congestion. No focal airspace disease or consolidation in the lungs. No blunting of costophrenic angles. No pneumothorax. Mediastinal contours appear intact. IMPRESSION: Mild cardiac enlargement.  No evidence of active pulmonary disease. Electronically Signed   By: Burman Nieves M.D.   On: 07/18/2015 22:46    Microbiology: No results found for this or any previous visit (from the past 240 hour(s)).   Labs: Basic Metabolic Panel:  Recent Labs Lab 07/18/15 2230 07/19/15 0653 07/20/15 0519  NA 141 140 139  K 3.5 3.6 3.7  CL 105 105 105  CO2 GLUCOSE 137* 189* 230*  BUN  6 8 10   CREATININE 0.43* 0.51 0.50  CALCIUM 9.0 9.1 9.3  MG  --  1.9  --    Liver Function Tests:  Recent Labs Lab 07/19/15 0100  BILITOT 0.3   No results for input(s): LIPASE, AMYLASE in the last 168 hours. No results for input(s): AMMONIA in the last 168 hours. CBC:  Recent Labs Lab 07/18/15 2330 07/19/15 0653 07/20/15 0519  WBC 9.1 11.0*  9.2 14.0*  NEUTROABS 6.6 8.6*  --   HGB 7.9* 7.0*  6.5* 8.2*  HCT 29.7* 28.4*  25.2* 29.7*  MCV 63.1* 66.7*  63.2* 64.1*  PLT 273 350  286 386   Cardiac Enzymes:  Recent Labs Lab 07/18/15 2230  TROPONINI <0.03   BNP: BNP (last 3 results)  Recent Labs  07/18/15 2230  BNP 83.0    ProBNP (last 3 results) No results for input(s): PROBNP in the last 8760 hours.  CBG:  Recent Labs Lab 07/19/15 1100 07/19/15 1645 07/19/15 2134 07/20/15 0802  GLUCAP 275* 262* 407* 236*       Signed:  Katherina Wimer MD, PhD  [facility] 07/20/2015, 11:55 AM

## 2015-07-20 NOTE — Evaluation (Signed)
Physical Therapy Evaluation Patient Details Name: Katherine Romero MRN: [id number] DOB: Jan 16, 1951 Today's Date: 07/20/2015   History of Present Illness  65 yo F admitted with exertional dyspnea for ~28month, and was found to be anemic with Hgb down to 7.9 on admission.  Pt received a blood transfusion and H&H now 8.2/29.7.  PMH: COPD, tobacco abuse, type 2 diabetes mellitus, and hypertension, knee surgery.   Clinical Impression  Pt received in bed, husband present, and pt is agreeable to PT evaluation.  Pt demonstrates independence with all functional mobility, including gait x459ft.  Pt was also able to negotiate 3 steps at Mod (I) with HR.  Pt reports she is a furniture walker at home, and uses a shopping cart when at the grocery store.  Pt states that she is moving better than before admission.  Pt does not demonstrate need for skilled PT at this time, therefore, will sign off.    Follow Up Recommendations No PT follow up    Equipment Recommendations  None recommended by PT    Recommendations for Other Services       Precautions / Restrictions Precautions Precautions: None Restrictions Weight Bearing Restrictions: No      Mobility  Bed Mobility Overal bed mobility: Independent                Transfers Overall transfer level: Independent                  Ambulation/Gait Ambulation/Gait assistance: Independent Ambulation Distance (Feet): 400 Feet Assistive device: None Gait Pattern/deviations: WFL(Within Functional Limits)     General Gait Details: Pt demonstrates occasional veering from path, but uses stepping strategy to correct balance. Pt states that she is moving better than baseline with increased cadence.   Stairs Stairs: Yes Stairs assistance: Modified independent (Device/Increase time) Stair Management: One rail Right Number of Stairs: 3 General stair comments: Pt states that there is always someone there to assist her when going up/down the  steps at home because there is no HR.   Wheelchair Mobility    Modified Rankin (Stroke Patients Only)       Balance Overall balance assessment: Independent                                           Pertinent Vitals/Pain Pain Assessment: No/denies pain    Home Living Family/patient expects to be discharged to:: Private residence Living Arrangements: Spouse/significant other;Children (2 adult sons) Available Help at Discharge: Family Type of Home: House Home Access: Stairs to enter Entrance Stairs-Rails: None Entrance Stairs-Number of Steps: 3 - pt states she always has to have someone to hold on to when going up/down the steps.  Home Layout: One level Home Equipment: Walker - 4 wheels;Wheelchair - manual Additional Comments: Husband uses the 4WW    Prior Function Level of Independence: Independent   Gait / Transfers Assistance Needed: Pt was independent with gait, but admits to furniture/wall walking in the house, and uses the shopping cart when in the community.   ADL's / Homemaking Assistance Needed: independent.         Hand Dominance        Extremity/Trunk Assessment   Upper Extremity Assessment: Overall WFL for tasks assessed           Lower Extremity Assessment: Overall WFL for tasks assessed         Communication  Communication: No difficulties  Cognition Arousal/Alertness: Awake/alert Behavior During Therapy: WFL for tasks assessed/performed Overall Cognitive Status: Within Functional Limits for tasks assessed                      General Comments      Exercises        Assessment/Plan    PT Assessment Patent does not need any further PT services  PT Diagnosis Difficulty walking   PT Problem List    PT Treatment Interventions     PT Goals (Current goals can be found in the Care Plan section) Acute Rehab PT Goals Patient Stated Goal: Pt states she just wants to go home.  PT Goal Formulation: With  patient/family Time For Goal Achievement: 07/20/15 Potential to Achieve Goals: Good    Frequency     Barriers to discharge        Co-evaluation               End of Session Equipment Utilized During Treatment: Gait belt Activity Tolerance: Patient tolerated treatment well Patient left: in bed;with family/visitor present Nurse Communication: Mobility status    Functional Assessment Tool Used: Clinical Judgement Functional Limitation: Mobility: Walking and moving around Mobility: Walking and Moving Around Current Status ([id number]): At least 1 percent but less than 20 percent impaired, limited or restricted Mobility: Walking and Moving Around Goal Status [phone]([id number]): At least 1 percent but less than 20 percent impaired, limited or restricted Mobility: Walking and Moving Around Discharge Status [phone]([id number]): At least 1 percent but less than 20 percent impaired, limited or restricted    Time: 1105-1119 PT Time Calculation (min) (ACUTE ONLY): 14 min   Charges:   PT Evaluation $PT Eval Low Complexity: 1 Procedure     PT G Codes:   PT G-Codes **NOT FOR INPATIENT CLASS** Functional Assessment Tool Used: Clinical Judgement Functional Limitation: Mobility: Walking and moving around Mobility: Walking and Moving Around Current Status ([id number]): At least 1 percent but less than 20 percent impaired, limited or restricted Mobility: Walking and Moving Around Goal Status [phone]([id number]): At least 1 percent but less than 20 percent impaired, limited or restricted Mobility: Walking and Moving Around Discharge Status [phone]([id number]): At least 1 percent but less than 20 percent impaired, limited or restricted    [name] 07/20/2015, 11:31 AM

## 2015-08-10 DIAGNOSIS — D509 Iron deficiency anemia, unspecified: Secondary | ICD-10-CM

## 2015-08-10 HISTORY — DX: Iron deficiency anemia, unspecified: D50.9

## 2015-09-14 DIAGNOSIS — K5900 Constipation, unspecified: Secondary | ICD-10-CM | POA: Insufficient documentation

## 2015-09-14 DIAGNOSIS — R195 Other fecal abnormalities: Secondary | ICD-10-CM | POA: Insufficient documentation

## 2015-09-14 HISTORY — DX: Other fecal abnormalities: R19.5

## 2015-09-25 ENCOUNTER — Encounter (HOSPITAL_COMMUNITY): Payer: Self-pay | Admitting: Emergency Medicine

## 2015-09-25 ENCOUNTER — Emergency Department (HOSPITAL_COMMUNITY): Payer: Medicaid Other

## 2015-09-25 ENCOUNTER — Observation Stay (HOSPITAL_COMMUNITY)
Admission: EM | Admit: 2015-09-25 | Discharge: 2015-09-27 | Disposition: A | Discharge status: Home / Self Care | Payer: Medicaid Other | Attending: Internal Medicine | Admitting: Internal Medicine

## 2015-09-25 DIAGNOSIS — I110 Hypertensive heart disease with heart failure: Secondary | ICD-10-CM | POA: Insufficient documentation

## 2015-09-25 DIAGNOSIS — F32A Depression, unspecified: Secondary | ICD-10-CM | POA: Diagnosis present

## 2015-09-25 DIAGNOSIS — J449 Chronic obstructive pulmonary disease, unspecified: Secondary | ICD-10-CM

## 2015-09-25 DIAGNOSIS — F419 Anxiety disorder, unspecified: Secondary | ICD-10-CM | POA: Diagnosis present

## 2015-09-25 DIAGNOSIS — R079 Chest pain, unspecified: Secondary | ICD-10-CM | POA: Diagnosis present

## 2015-09-25 DIAGNOSIS — R072 Precordial pain: Secondary | ICD-10-CM

## 2015-09-25 DIAGNOSIS — F329 Major depressive disorder, single episode, unspecified: Secondary | ICD-10-CM | POA: Diagnosis not present

## 2015-09-25 DIAGNOSIS — Z79899 Other long term (current) drug therapy: Secondary | ICD-10-CM | POA: Diagnosis not present

## 2015-09-25 DIAGNOSIS — I252 Old myocardial infarction: Secondary | ICD-10-CM | POA: Diagnosis not present

## 2015-09-25 DIAGNOSIS — F17210 Nicotine dependence, cigarettes, uncomplicated: Secondary | ICD-10-CM | POA: Diagnosis not present

## 2015-09-25 DIAGNOSIS — Z720 Tobacco use: Secondary | ICD-10-CM | POA: Diagnosis not present

## 2015-09-25 DIAGNOSIS — M79606 Pain in leg, unspecified: Secondary | ICD-10-CM

## 2015-09-25 DIAGNOSIS — R0789 Other chest pain: Secondary | ICD-10-CM

## 2015-09-25 DIAGNOSIS — F411 Generalized anxiety disorder: Secondary | ICD-10-CM | POA: Diagnosis present

## 2015-09-25 DIAGNOSIS — E119 Type 2 diabetes mellitus without complications: Secondary | ICD-10-CM | POA: Insufficient documentation

## 2015-09-25 DIAGNOSIS — D581 Hereditary elliptocytosis: Secondary | ICD-10-CM

## 2015-09-25 DIAGNOSIS — R0609 Other forms of dyspnea: Secondary | ICD-10-CM

## 2015-09-25 DIAGNOSIS — I509 Heart failure, unspecified: Secondary | ICD-10-CM | POA: Diagnosis not present

## 2015-09-25 DIAGNOSIS — R200 Anesthesia of skin: Secondary | ICD-10-CM | POA: Insufficient documentation

## 2015-09-25 DIAGNOSIS — D649 Anemia, unspecified: Secondary | ICD-10-CM

## 2015-09-25 DIAGNOSIS — J410 Simple chronic bronchitis: Secondary | ICD-10-CM

## 2015-09-25 DIAGNOSIS — J441 Chronic obstructive pulmonary disease with (acute) exacerbation: Secondary | ICD-10-CM

## 2015-09-25 HISTORY — DX: Other chest pain: R07.89

## 2015-09-25 HISTORY — DX: Generalized anxiety disorder: F41.1

## 2015-09-25 HISTORY — DX: Chronic obstructive pulmonary disease, unspecified: J44.9

## 2015-09-25 LAB — COMPREHENSIVE METABOLIC PANEL
ALBUMIN: 4.2 g/dL (ref 3.5–5.0)
ALK PHOS: 70 U/L (ref 38–126)
ALT: 12 U/L — ABNORMAL LOW (ref 14–54)
AST: 16 U/L (ref 15–41)
Anion gap: 7 (ref 5–15)
BILIRUBIN TOTAL: 0.2 mg/dL — AB (ref 0.3–1.2)
BUN: 14 mg/dL (ref 6–20)
CALCIUM: 8.8 mg/dL — AB (ref 8.9–10.3)
CO2: 28 mmol/L (ref 22–32)
Chloride: 104 mmol/L (ref 101–111)
Creatinine, Ser: 0.69 mg/dL (ref 0.44–1.00)
GFR calc Af Amer: >60 mL/min (ref >60)
GFR calc non Af Amer: >60 mL/min (ref >60)
GLUCOSE: 96 mg/dL (ref 65–99)
Potassium: 3.4 mmol/L — ABNORMAL LOW (ref 3.5–5.1)
Sodium: 139 mmol/L (ref 135–145)
TOTAL PROTEIN: 6.8 g/dL (ref 6.5–8.1)

## 2015-09-25 LAB — PROTIME-INR
INR: 1.02 (ref 0.00–1.49)
Prothrombin Time: 13.6 seconds (ref 11.6–15.2)

## 2015-09-25 LAB — CBC
HEMATOCRIT: 41.8 % (ref 36.0–46.0)
HEMOGLOBIN: 13.5 g/dL (ref 12.0–15.0)
MCH: 26.6 pg (ref 26.0–34.0)
MCHC: 32.3 g/dL (ref 30.0–36.0)
MCV: 82.4 fL (ref 78.0–100.0)
Platelets: 179 10*3/uL (ref 150–400)
RBC: 5.07 MIL/uL (ref 3.87–5.11)
RDW: 21.6 % — ABNORMAL HIGH (ref 11.5–15.5)
WBC: 8.6 10*3/uL (ref 4.0–10.5)

## 2015-09-25 LAB — TROPONIN I: Troponin I: <0.03 ng/mL (ref <0.03)

## 2015-09-25 LAB — APTT: APTT: 36 s (ref 24–37)

## 2015-09-25 MED ORDER — NITROGLYCERIN 2 % TD OINT
0.5000 [in_us] | TOPICAL_OINTMENT | Freq: Once | TRANSDERMAL | Status: AC
  Administered 2015-09-25: 0.5 [in_us] via TOPICAL
  Filled 2015-09-25: qty 1

## 2015-09-25 NOTE — ED Notes (Signed)
Pt c/o intermittent chest pain all day. Pt denies pain at this time.

## 2015-09-25 NOTE — H&P (Signed)
History and Physical    Katherine IgoDeborah R Stokes UYQ:[id number] DOB: 1951/01/11 DOA: 09/25/2015  Referring MD/NP/PA: Eber HongBrian Miller, MD  PCP: Kirstie PeriSHAH,ASHISH, MD  Outpatient Specialists: None Patient coming from: Home   Chief Complaint: Chest pain  HPI: Katherine Romero is a 65 y.o. female with medical history significant of COPD with tobacco use, DM Type 2, CHF, depression, and MI presented with complaints of intermittent chest pain and associated left arm numbness and diaphoresis. Pain was started in the middle of her  chest and radiated to her back and down her left arm. Patient reports that the pain lasted 30 minutes and spontaneously resolved. The pain is similar to past reflux except the reflux pain was in her abdomen. She also admits past anxiety issues. She is currently taking her ASA as prescribed and has recently started prevacid for her reflux.   ED Course: Potassium 3.4, CXR no acute disease and troponin negative. Due to her cardiac risk factors she has been referred for admission.   Review of Systems: As per HPI otherwise 10 point review of systems negative.    Past Medical History  Diagnosis Date  . COPD (chronic obstructive pulmonary disease) (HCC)   . Hypertension   . Diabetes mellitus without complication (HCC)   . Hypercholesteremia   . CHF (congestive heart failure) (HCC)   . Depression     Past Surgical History  Procedure Laterality Date  . Knee surgery    . Carpal tunnel release       reports that she has been smoking Cigarettes.  She does not have any smokeless tobacco history on file. She reports that she does not drink alcohol or use illicit drugs.  No Known Allergies  Family History  Problem Relation Age of Onset  . Anemia Son     Prior to Admission medications   Medication Sig Start Date End Date Taking? Authorizing Provider  albuterol (PROVENTIL) (2.5 MG/3ML) 0.083% nebulizer solution INHALE ONE VIAL IN NEBULIZER DAILY AS NEEDED FOR WHEEZING-SHORTNESS OF  BREATH 05/04/15  Yes Historical Provider, MD  Albuterol Sulfate (PROAIR RESPICLICK) 108 (90 Base) MCG/ACT AEPB Inhale 2 puffs into the lungs every 6 (six) hours as needed (for wheezing/shortness of breath).    Yes Historical Provider, MD  amLODipine (NORVASC) 5 MG tablet Take 5 mg by mouth daily. 05/04/15  Yes Historical Provider, MD  aspirin EC 81 MG tablet Take 81 mg by mouth daily.   Yes Historical Provider, MD  Aspirin-Salicylamide-Caffeine (BC HEADACHE) 325-95-16 MG TABS Take 1 packet by mouth 3 (three) times daily as needed (for pain).   Yes Historical Provider, MD  atorvastatin (LIPITOR) 40 MG tablet Take 40 mg by mouth daily. 05/04/15  Yes Historical Provider, MD  lisinopril (PRINIVIL,ZESTRIL) 40 MG tablet Take 40 mg by mouth daily. 05/04/15  Yes Historical Provider, MD  metoprolol (LOPRESSOR) 100 MG tablet Take 100 mg by mouth 2 (two) times daily.   Yes Historical Provider, MD  polyethylene glycol powder (GLYCOLAX/MIRALAX) powder Take 17 g by mouth daily.   Yes Historical Provider, MD  doxycycline (VIBRAMYCIN) 100 MG capsule Take 1 capsule (100 mg total) by mouth 2 (two) times daily. Patient not taking: Reported on 09/25/2015 07/20/15   Albertine GratesFang Xu, MD  ferrous sulfate 325 (65 FE) MG tablet Take 1 tablet (325 mg total) by mouth every other day. Patient not taking: Reported on 09/25/2015 07/20/15   Albertine GratesFang Xu, MD  predniSONE (DELTASONE) 10 MG tablet Take 5 tabs on day one, take 4 tabs on day  two, take 3 tabs on day three, take 2tabs on day four, take one tab on day five, then stop. Patient not taking: Reported on 09/25/2015 07/20/15   Albertine GratesFang Xu, MD  senna-docusate (SENOKOT-S) 8.6-50 MG tablet Take 1 tablet by mouth at bedtime. Patient not taking: Reported on 09/25/2015 07/20/15   Albertine GratesFang Xu, MD    Physical Exam: Filed Vitals:   09/25/15 2230 09/25/15 2300  BP: 152/76 144/65  Pulse: 72 75  Resp: 12 11  SpO2: 95% 95%      Filed Vitals:   09/25/15 2230 09/25/15 2300  BP: 152/76 144/65  Pulse: 72 75  Resp:  12 11  SpO2: 95% 95%    Constitutional: NAD, calm, comfortable ENMT: Mucous membranes are moist. Posterior pharynx clear of any exudate or lesions.Normal dentition.  Neck: normal, supple, no masses, no thyromegaly Respiratory: clear to auscultation bilaterally, no wheezing, no crackles. Normal respiratory effort. No accessory muscle use.  Cardiovascular: Regular rate and rhythm, no murmurs / rubs / gallops. No extremity edema.  Abdomen: no tenderness, no masses palpated.  Musculoskeletal: no clubbing / cyanosis. No joint deformity upper and lower extremities. Good ROM, no contractures. Normal muscle tone.  Skin: no rashes, lesions, ulcers. No induration Neurologic: CN 2-12 grossly intact. Strength 5/5 in all 4.  Psychiatric: Normal judgment and insight. Alert and oriented x 3. Normal mood.    Labs on Admission: I have personally reviewed following labs and imaging studies  CBC:  Recent Labs Lab 09/25/15 2245  WBC 8.6  HGB 13.5  HCT 41.8  MCV 82.4  PLT 179   Basic Metabolic Panel:  Recent Labs Lab 09/25/15 2245  NA 139  K 3.4*  CL 104  CO2 28  GLUCOSE 96  BUN 14  CREATININE 0.69  CALCIUM 8.8*   Liver Function Tests:  Recent Labs Lab 09/25/15 2245  AST 16  ALT 12*  ALKPHOS 70  BILITOT 0.2*  PROT 6.8  ALBUMIN 4.2   Coagulation Profile:  Recent Labs Lab 09/25/15 2245  INR 1.02   Cardiac Enzymes:  Recent Labs Lab 09/25/15 2245  TROPONINI <0.03   Urine analysis:    Component Value Date/Time   COLORURINE YELLOW 07/19/2015 1027   APPEARANCEUR CLEAR 07/19/2015 1027   LABSPEC <1.005* 07/19/2015 1027   PHURINE 6.5 07/19/2015 1027   GLUCOSEU 100* 07/19/2015 1027   HGBUR NEGATIVE 07/19/2015 1027   BILIRUBINUR NEGATIVE 07/19/2015 1027   KETONESUR NEGATIVE 07/19/2015 1027   PROTEINUR NEGATIVE 07/19/2015 1027   NITRITE NEGATIVE 07/19/2015 1027   LEUKOCYTESUR NEGATIVE 07/19/2015 1027    Radiological Exams on Admission: Dg Chest Portable 1  View  09/25/2015  CLINICAL DATA:  Central chest pain. EXAM: PORTABLE CHEST 1 VIEW COMPARISON:  July 18, 2015 FINDINGS: The heart size and mediastinal contours are within normal limits. Both lungs are clear. The visualized skeletal structures are unremarkable. IMPRESSION: No active disease. Electronically Signed   By: Gerome Samavid  Williams III M.D   On: 09/25/2015 22:46    EKG: Independently reviewed. SR  Assessment/Plan   1. Atypical chest pain. Will admit for cardiac r/out.  Obtain ECHO, cycle troponins, continue ASA and home meds, including statin and beta blocker.  2. COPD, stable. Continue with meds.  3. Depression, stable. 4. Tobacco use, counseled on cessation.    DVT prophylaxis: heparin SQ.  Code Status: Full Family Communication: No family bedside  Disposition Plan: Anticipiate discharge home in 24-48 hours.   Consults called: None  Admission status: Observation, medical bed.  Houston Siren, MD FACP.  [facility] If 7PM-7AM, please contact night-coverage [email or web address] Password [email or web address]  09/25/2015, 11:35 PM    By signing my name below, I, Zadie Cleverly, attest that this documentation has been prepared under the direction and in the presence of Houston Siren, MD. Electronically signed: Zadie Cleverly, Scribe. 09/25/2015 11:30pm   I, Dr. Houston Siren, personally performed the services described in this documentation. All medical record entries made by the scribe were at my discretion and in my presence. Houston Siren, MD 09/25/2015

## 2015-09-25 NOTE — ED Provider Notes (Signed)
CSN: [id number]     Arrival date & time 09/25/15  2204 History  By signing my name below, I, Rosario AdieWilliam Andrew Hiatt, attest that this documentation has been prepared under the direction and in the presence of Eber HongBrian Aubrei Bouchie, MD.  Electronically Signed: Rosario AdieWilliam Andrew Hiatt, ED Scribe. 09/25/2015. 10:53 PM.   Chief Complaint  Patient presents with  . Chest Pain   The history is provided by the patient. No language interpreter was used.   HPI Comments: Katherine Romero is a 65 y.o. female with a PMHx of COPD, HTN, DM, CHF, and MI who presents to the Emergency Department complaining of sudden onset, resolved chest pain onset PTA. She decribes her pain as feeling like "someone sitting on her chest". She states that her most recent episode lasted approximately 30 minutes, and during the episode she was diaphoretic and that her left arm became numb. Pt has a hx of MI when she was 65 y.o., and states that today's CP feels similar. She has chronic SOB which is attributed to her COPD. She takes 81mg  of Asprin daily. Denies nausea.   Past Medical History  Diagnosis Date  . COPD (chronic obstructive pulmonary disease) (HCC)   . Hypertension   . Diabetes mellitus without complication (HCC)   . Hypercholesteremia   . CHF (congestive heart failure) (HCC)   . Depression    Past Surgical History  Procedure Laterality Date  . Knee surgery    . Carpal tunnel release     Family History  Problem Relation Age of Onset  . Anemia Son    Social History  Substance Use Topics  . Smoking status: Current Every Day Smoker    Types: Cigarettes  . Smokeless tobacco: None  . Alcohol Use: No   OB History    No data available     Review of Systems  Constitutional: Positive for diaphoresis.  Cardiovascular: Positive for chest pain.  Gastrointestinal: Negative for nausea.  Neurological: Positive for numbness.  All other systems reviewed and are negative.  Allergies  Review of patient's allergies indicates  no known allergies.  Home Medications   Prior to Admission medications   Medication Sig Start Date End Date Taking? Authorizing Provider  albuterol (PROVENTIL) (2.5 MG/3ML) 0.083% nebulizer solution INHALE ONE VIAL IN NEBULIZER DAILY AS NEEDED FOR WHEEZING-SHORTNESS OF BREATH 05/04/15  Yes Historical Provider, MD  Albuterol Sulfate (PROAIR RESPICLICK) 108 (90 Base) MCG/ACT AEPB Inhale 2 puffs into the lungs every 6 (six) hours as needed (for wheezing/shortness of breath).    Yes Historical Provider, MD  amLODipine (NORVASC) 5 MG tablet Take 5 mg by mouth daily. 05/04/15  Yes Historical Provider, MD  aspirin EC 81 MG tablet Take 81 mg by mouth daily.   Yes Historical Provider, MD  Aspirin-Salicylamide-Caffeine (BC HEADACHE) 325-95-16 MG TABS Take 1 packet by mouth 3 (three) times daily as needed (for pain).   Yes Historical Provider, MD  atorvastatin (LIPITOR) 40 MG tablet Take 40 mg by mouth daily. 05/04/15  Yes Historical Provider, MD  lisinopril (PRINIVIL,ZESTRIL) 40 MG tablet Take 40 mg by mouth daily. 05/04/15  Yes Historical Provider, MD  metoprolol (LOPRESSOR) 100 MG tablet Take 100 mg by mouth 2 (two) times daily.   Yes Historical Provider, MD  polyethylene glycol powder (GLYCOLAX/MIRALAX) powder Take 17 g by mouth daily.   Yes Historical Provider, MD  doxycycline (VIBRAMYCIN) 100 MG capsule Take 1 capsule (100 mg total) by mouth 2 (two) times daily. Patient not taking: Reported on 09/25/2015  07/20/15   Albertine Grates, MD  ferrous sulfate 325 (65 FE) MG tablet Take 1 tablet (325 mg total) by mouth every other day. Patient not taking: Reported on 09/25/2015 07/20/15   Albertine Grates, MD  predniSONE (DELTASONE) 10 MG tablet Take 5 tabs on day one, take 4 tabs on day two, take 3 tabs on day three, take 2tabs on day four, take one tab on day five, then stop. Patient not taking: Reported on 09/25/2015 07/20/15   Albertine Grates, MD  senna-docusate (SENOKOT-S) 8.6-50 MG tablet Take 1 tablet by mouth at bedtime. Patient not  taking: Reported on 09/25/2015 07/20/15   Albertine Grates, MD   BP 161/69 mmHg  Pulse 72  Resp 18  SpO2 94% Physical Exam  Constitutional: She is oriented to person, place, and time. She appears well-developed and well-nourished. No distress.  HENT:  Head: Normocephalic and atraumatic.  Mouth/Throat: Oropharynx is clear and moist. No oropharyngeal exudate.  Eyes: Conjunctivae and EOM are normal. Pupils are equal, round, and reactive to light. Right eye exhibits no discharge. Left eye exhibits no discharge. No scleral icterus.  Neck: Normal range of motion. Neck supple. No JVD present. No thyromegaly present.  Cardiovascular: Normal rate, regular rhythm, normal heart sounds and intact distal pulses.  Exam reveals no gallop and no friction rub.   No murmur heard. Pulmonary/Chest: Effort normal. No respiratory distress. She has wheezes (Mild expiratory). She has no rales.  Abdominal: Soft. Bowel sounds are normal. She exhibits no distension and no mass. There is no tenderness.  Musculoskeletal: Normal range of motion. She exhibits no edema or tenderness.  No edema, no asymmetry in her feet.   Lymphadenopathy:    She has no cervical adenopathy.  Neurological: She is alert and oriented to person, place, and time. Coordination normal.  Skin: Skin is warm and dry. No rash noted. No erythema.  Psychiatric: She has a normal mood and affect. Her behavior is normal.  Nursing note and vitals reviewed.   ED Course  Procedures (including critical care time)  DIAGNOSTIC STUDIES: Oxygen Saturation is 95% on RA, adequate by my interpretation.   COORDINATION OF CARE: 10:52 PM-Discussed next steps with pt. Pt verbalized understanding and is agreeable with the plan.   Labs Review Labs Reviewed  CBC - Abnormal; Notable for the following:    RDW 21.6 (*)    All other components within normal limits  COMPREHENSIVE METABOLIC PANEL - Abnormal; Notable for the following:    Potassium 3.4 (*)    Calcium 8.8  (*)    ALT 12 (*)    Total Bilirubin 0.2 (*)    All other components within normal limits  APTT  PROTIME-INR  TROPONIN I    Imaging Review Dg Chest Portable 1 View  09/25/2015  CLINICAL DATA:  Central chest pain. EXAM: PORTABLE CHEST 1 VIEW COMPARISON:  July 18, 2015 FINDINGS: The heart size and mediastinal contours are within normal limits. Both lungs are clear. The visualized skeletal structures are unremarkable. IMPRESSION: No active disease. Electronically Signed   By: Gerome Sam III M.D   On: 09/25/2015 22:46    I have personally reviewed and evaluated these images and lab results as part of my medical decision-making.   EKG Interpretation   Date/Time:  Tuesday September 25 2015 22:08:54 EDT Ventricular Rate:  74 PR Interval:    QRS Duration: 87 QT Interval:  405 QTC Calculation: 450 R Axis:   129 Text Interpretation:  Sinus rhythm Right axis deviation Low  voltage,  precordial leads since last tracing no significant change Confirmed by  Tyreese Thain  MD, Makaria Poarch ([id number]) on 09/25/2015 10:18:29 PM      MDM   Final diagnoses:  Chest pain, unspecified chest pain type   Suspect ACS needs admission for r/o if neg enzymes VS followed - ongoing mild hypertension Minimal wheeze Trop neg ECG without acute ischemia. D/w Dr. Conley RollsLe who will admit  Medications  nitroGLYCERIN (NITROGLYN) 2 % ointment 0.5 inch (0.5 inches Topical Given 09/25/15 2247)       Eber HongBrian Lucilia Yanni, MD 09/25/15 [phone]

## 2015-09-26 ENCOUNTER — Observation Stay (HOSPITAL_BASED_OUTPATIENT_CLINIC_OR_DEPARTMENT_OTHER): Payer: Medicaid Other

## 2015-09-26 ENCOUNTER — Observation Stay (HOSPITAL_COMMUNITY): Payer: Medicaid Other

## 2015-09-26 ENCOUNTER — Encounter (HOSPITAL_COMMUNITY): Payer: Self-pay

## 2015-09-26 DIAGNOSIS — J438 Other emphysema: Secondary | ICD-10-CM | POA: Diagnosis not present

## 2015-09-26 DIAGNOSIS — R0789 Other chest pain: Secondary | ICD-10-CM

## 2015-09-26 DIAGNOSIS — R079 Chest pain, unspecified: Secondary | ICD-10-CM | POA: Insufficient documentation

## 2015-09-26 DIAGNOSIS — R072 Precordial pain: Secondary | ICD-10-CM | POA: Diagnosis not present

## 2015-09-26 DIAGNOSIS — J42 Unspecified chronic bronchitis: Secondary | ICD-10-CM

## 2015-09-26 DIAGNOSIS — J449 Chronic obstructive pulmonary disease, unspecified: Secondary | ICD-10-CM | POA: Diagnosis not present

## 2015-09-26 DIAGNOSIS — F419 Anxiety disorder, unspecified: Secondary | ICD-10-CM | POA: Diagnosis not present

## 2015-09-26 DIAGNOSIS — F329 Major depressive disorder, single episode, unspecified: Secondary | ICD-10-CM | POA: Diagnosis not present

## 2015-09-26 DIAGNOSIS — E119 Type 2 diabetes mellitus without complications: Secondary | ICD-10-CM | POA: Diagnosis not present

## 2015-09-26 LAB — ECHOCARDIOGRAM COMPLETE
CHL CUP RV SYS PRESS: 33 mmHg
CHL CUP STROKE VOLUME: 39 mL
E decel time: 268 msec
EERAT: 16.19
FS: 41 % (ref 28–44)
Height: 64 in
IVS/LV PW RATIO, ED: 1.01
LA ID, A-P, ES: 38 mm
LA vol index: 29.7 mL/m2
LA vol: 50.2 mL
LADIAMINDEX: 2.25 cm/m2
LAVOLA4C: 57 mL
LEFT ATRIUM END SYS DIAM: 38 mm
LV E/e' medial: 16.19
LV PW d: 10.7 mm — AB (ref 0.6–1.1)
LV SIMPSON'S DISK: 68
LV TDI E'LATERAL: 7.29
LV dias vol: 57 mL (ref 46–106)
LV e' LATERAL: 7.29 cm/s
LV sys vol index: 11 mL/m2
LVDIAVOLIN: 34 mL/m2
LVEEAVG: 16.19
LVOT area: 2.27 cm2
LVOTD: 17 mm
LVSYSVOL: 18 mL (ref 14–42)
MV Dec: 268
MV Peak grad: 6 mmHg
MV pk E vel: 118 m/s
MVPKAVEL: 108 m/s
RV LATERAL S' VELOCITY: 12.4 cm/s
Reg peak vel: 275 cm/s
TAPSE: 23 mm
TDI e' medial: 6.74
TR max vel: 275 cm/s
Weight: 2211.2 oz

## 2015-09-26 LAB — TROPONIN I: Troponin I: <0.03 ng/mL (ref <0.03)

## 2015-09-26 LAB — GLUCOSE, CAPILLARY: Glucose-Capillary: 162 mg/dL — ABNORMAL HIGH (ref 65–99)

## 2015-09-26 LAB — D-DIMER, QUANTITATIVE (NOT AT ARMC): D DIMER QUANT: 2.49 ug{FEU}/mL — AB (ref 0.00–0.50)

## 2015-09-26 LAB — TSH: TSH: 3.983 u[IU]/mL (ref 0.350–4.500)

## 2015-09-26 MED ORDER — ALBUTEROL SULFATE (2.5 MG/3ML) 0.083% IN NEBU
2.5000 mg | INHALATION_SOLUTION | RESPIRATORY_TRACT | Status: DC | PRN
  Administered 2015-09-26: 2.5 mg via RESPIRATORY_TRACT
  Filled 2015-09-26: qty 3

## 2015-09-26 MED ORDER — AMLODIPINE BESYLATE 5 MG PO TABS
5.0000 mg | ORAL_TABLET | Freq: Every day | ORAL | Status: DC
  Administered 2015-09-26 – 2015-09-27 (×2): 5 mg via ORAL
  Filled 2015-09-26 (×2): qty 1

## 2015-09-26 MED ORDER — SODIUM CHLORIDE 0.9% FLUSH
3.0000 mL | Freq: Two times a day (BID) | INTRAVENOUS | Status: DC
  Administered 2015-09-26 – 2015-09-27 (×4): 3 mL via INTRAVENOUS

## 2015-09-26 MED ORDER — HEPARIN SODIUM (PORCINE) 5000 UNIT/ML IJ SOLN
5000.0000 [IU] | Freq: Three times a day (TID) | INTRAMUSCULAR | Status: DC
  Administered 2015-09-26 – 2015-09-27 (×4): 5000 [IU] via SUBCUTANEOUS
  Filled 2015-09-26 (×4): qty 1

## 2015-09-26 MED ORDER — ASPIRIN EC 81 MG PO TBEC
81.0000 mg | DELAYED_RELEASE_TABLET | Freq: Every day | ORAL | Status: DC
  Administered 2015-09-26 – 2015-09-27 (×2): 81 mg via ORAL
  Filled 2015-09-26 (×2): qty 1

## 2015-09-26 MED ORDER — METOPROLOL TARTRATE 50 MG PO TABS
100.0000 mg | ORAL_TABLET | Freq: Two times a day (BID) | ORAL | Status: DC
  Administered 2015-09-26 – 2015-09-27 (×4): 100 mg via ORAL
  Filled 2015-09-26 (×4): qty 2

## 2015-09-26 MED ORDER — LISINOPRIL 10 MG PO TABS
40.0000 mg | ORAL_TABLET | Freq: Every day | ORAL | Status: DC
  Administered 2015-09-26 – 2015-09-27 (×2): 40 mg via ORAL
  Filled 2015-09-26 (×2): qty 4

## 2015-09-26 MED ORDER — MORPHINE SULFATE (PF) 2 MG/ML IV SOLN: 2.0000 mg | INTRAVENOUS | Status: DC | PRN

## 2015-09-26 MED ORDER — IOPAMIDOL (ISOVUE-370) INJECTION 76%
100.0000 mL | Freq: Once | INTRAVENOUS | Status: AC | PRN
  Administered 2015-09-26: 100 mL via INTRAVENOUS

## 2015-09-26 MED ORDER — ATORVASTATIN CALCIUM 40 MG PO TABS
40.0000 mg | ORAL_TABLET | Freq: Every day | ORAL | Status: DC
  Administered 2015-09-26 – 2015-09-27 (×2): 40 mg via ORAL
  Filled 2015-09-26 (×2): qty 1

## 2015-09-26 MED ORDER — ALBUTEROL SULFATE 108 (90 BASE) MCG/ACT IN AEPB: 2.0000 | INHALATION_SPRAY | Freq: Four times a day (QID) | RESPIRATORY_TRACT | Status: DC | PRN

## 2015-09-26 NOTE — Consult Note (Signed)
Primary cardiologist: N/A Consulting cardiologist: Dr Dina RichJonathan Minna Dumire MD Indication: Chest pain Requesting physician: Dr Houston SirenPeter Le  Clinical Summary Katherine Romero is a 65 y.o.female history of COPD, DM2, DVT 2011. She thinks she had an MI at age 65 but is unclear of specifics, does not think she had a stent. States she was told she had CHF by her pcp when she had some LE edema, she is unsure of specifics and if she has had an echo before. She is admitted with chest pain. Episode at rest last night. 8/10 tightness in epigastric area with diaphoresis and dizziness, lasted approx 30 minutes. She has had similar pain on and off for several years but not this intense. She reports chronic DOE at <1/2 block that is unchanged. Intermittent LE edema.    ER vitals: p 98 bp 152/76 95% RA K 3.4, Cr 0.69, Hgb 13.5, Plt 179, trop neg x 3, D-dimer pending EKG SR, RAD, S1Q3T3 CXR no acute process       No Known Allergies  Medications Scheduled Medications: . amLODipine  5 mg Oral Daily  . aspirin EC  81 mg Oral Daily  . atorvastatin  40 mg Oral Daily  . heparin  5,000 Units Subcutaneous Q8H  . lisinopril  40 mg Oral Daily  . metoprolol  100 mg Oral BID  . sodium chloride flush  3 mL Intravenous Q12H     Infusions:     PRN Medications:  albuterol, morphine injection   Past Medical History  Diagnosis Date  . COPD (chronic obstructive pulmonary disease) (HCC)   . Hypertension   . Diabetes mellitus without complication (HCC)   . Hypercholesteremia   . CHF (congestive heart failure) (HCC)   . Depression     Past Surgical History  Procedure Laterality Date  . Knee surgery    . Carpal tunnel release      Family History  Problem Relation Age of Onset  . Anemia Son     Social History Katherine Romero reports that she has been smoking Cigarettes.  She does not have any smokeless tobacco history on file. Katherine Romero reports that she does not drink alcohol.  Review of  Systems CONSTITUTIONAL: No weight loss, fever, chills, weakness or fatigue.  HEENT: Eyes: No visual loss, blurred vision, double vision or yellow sclerae. No hearing loss, sneezing, congestion, runny nose or sore throat.  SKIN: No rash or itching.  CARDIOVASCULAR: No chest pain, chest pressure or chest discomfort. No palpitations or edema.  RESPIRATORY: No shortness of breath, cough or sputum.  GASTROINTESTINAL: No anorexia, nausea, vomiting or diarrhea. No abdominal pain or blood.  GENITOURINARY: no polyuria, no dysuria NEUROLOGICAL: No headache, dizziness, syncope, paralysis, ataxia, numbness or tingling in the extremities. No change in bowel or bladder control.  MUSCULOSKELETAL: No muscle, back pain, joint pain or stiffness.  HEMATOLOGIC: No anemia, bleeding or bruising.  LYMPHATICS: No enlarged nodes. No history of splenectomy.  PSYCHIATRIC: No history of depression or anxiety.      Physical Examination Blood pressure 148/66, pulse 56, temperature 98.2 F (36.8 C), temperature source Oral, resp. rate 16, height 5\' 4"  (1.626 m), weight 138 lb 3.2 oz (62.687 kg), SpO2 95 %.  Intake/Output Summary (Last 24 hours) at 09/26/15 0947 Last data filed at 09/26/15 0900  Gross per 24 hour  Intake    240 ml  Output      0 ml  Net    240 ml    HEENT: sclera clear, throat  clear  Cardiovascular: RRR, no mr/g, no jvd  Respiratory: CTAB  GI: abdomen soft, NT, ND  MSK: no LE edema  Neuro: no focal deficits  Psych: appropriate affect   Lab Results  Basic Metabolic Panel:  Recent Labs Lab 09/25/15 2245  NA 139  K 3.4*  CL 104  CO2 28  GLUCOSE 96  BUN 14  CREATININE 0.69  CALCIUM 8.8*    Liver Function Tests:  Recent Labs Lab 09/25/15 2245  AST 16  ALT 12*  ALKPHOS 70  BILITOT 0.2*  PROT 6.8  ALBUMIN 4.2    CBC:  Recent Labs Lab 09/25/15 2245  WBC 8.6  HGB 13.5  HCT 41.8  MCV 82.4  PLT 179    Cardiac Enzymes:  Recent Labs Lab 09/25/15 2245  09/26/15 0051 09/26/15 0621  TROPONINI <0.03 <0.03 <0.03    BNP: Invalid input(s): POCBNP      Impression/Recommendations 1. Chest pain  - unclear etiology. Though presentation not classic for PE, she does appear to have new right axis deviation on EKG with S1,Q3,T3 as well as elevated D-dimer and history of DVT in 2011 - recommend CT PE to rule out PE -  At this time no objective evidence of ACS. F/u CT PE and echo, would not pursue ischemic testing at this time though may consider pending CT and echo results.    2. CHF - self reported history, details are unclar - f/u echo    Dina RichJonathan Avon Mergenthaler, M.D.

## 2015-09-26 NOTE — Progress Notes (Signed)
*  PRELIMINARY RESULTS* Echocardiogram 2D Echocardiogram has been performed.  Stacey DrainWhite, Lorelie Biermann J 09/26/2015, 12:36 PM

## 2015-09-26 NOTE — Progress Notes (Addendum)
[facility] PROGRESS NOTE  Katherine IgoDeborah R Romero ZOX:[id number] DOB: 25-Dec-1950 DOA: 09/25/2015   PCP: Kirstie PeriSHAH,ASHISH, MD     Assessment/Plan: Principal Problem:   Atypical chest pain Active Problems:   Depression   Tobacco abuse   COPD (chronic obstructive pulmonary disease) (HCC)   Anxiety   Pain in the chest   65 y.o. female with medical history significant of COPD with tobacco use, DM Type 2, CHF, depression, and MI presented with complaints of intermittent chest pain and associated left arm numbness and diaphoresis. Pain was started in the middle of her chest and radiated to her back and down her left arm. Patient reports that the pain lasted 30 minutes and spontaneously resolved. The pain is similar to past reflux except the reflux pain was in her abdomen. She also admits past anxiety issues. She is currently taking her ASA as prescribed and has recently started prevacid for her reflux.   ED Course: Potassium 3.4, CXR no acute disease and troponin negative. Due to her cardiac risk factors she has been referred for admission  Assessment and plan Unstable angina-cardiac enzymes negative thus, multiple risk factors including smoking, diabetes, hypertension I have consulted cardiology for further evaluation, patient will be kept NPO  pending further cardiology recommendations Repeat EKG, d-dimer abnormal, therefore we'll order CT of the chest to rule out PE  Continue aspirin, beta blocker, statin  2-D echo to rule out wall motion abnormalities  COPD without exacerbation -continue prn nebulizer treatments,   Essential hypertension -ACE inhibitor, metoprolol   Diabetes mellitus without complications-uncontrolled,   most recent A1c 6.3, repeat A1c   Dyslipidemia -continue statin   Depression    DVT prophylaxsis Heparin    Code Status:  Full code   Family Communication: Discussed in detail with the patient, all imaging results, lab results explained to the patient    Disposition Plan:  cardiology consultation, possible stress test, anticipate discharge tomorrow      Consultants: Cardiology  Procedures-none   Anti-infectives    None         HPI/Subjective: Chest pain-free, however has been having intermittent chest pain described as chest tightness and heaviness with exertion   Objective: Filed Vitals:   09/25/15 2338 09/26/15 0000 09/26/15 0039 09/26/15 0536  BP: 161/69 138/62 123/62 148/66  Pulse: 72 69 66 56  Temp:   98.5 F (36.9 C) 98.2 F (36.8 C)  TempSrc:   Oral Oral  Resp: 18 17 15 16   Height:   5\' 4"  (1.626 m)   Weight:   62.687 kg (138 lb 3.2 oz)   SpO2: 94% 94% 94% 95%    Intake/Output Summary (Last 24 hours) at 09/26/15 0954 Last data filed at 09/26/15 0900  Gross per 24 hour  Intake    240 ml  Output      0 ml  Net    240 ml    Exam:  Examination:  General exam: Appears calm and comfortable  Respiratory system: Clear to auscultation. Respiratory effort normal. Cardiovascular system: S1 & S2 heard, RRR. No JVD, murmurs, rubs, gallops or clicks. No pedal edema. Gastrointestinal system: Abdomen is nondistended, soft and nontender. No organomegaly or masses felt. Normal bowel sounds heard. Central nervous system: Alert and oriented. No focal neurological deficits. Extremities: Symmetric 5 x 5 power. Skin: No rashes, lesions or ulcers Psychiatry: Judgement and insight appear normal. Mood & affect appropriate.     Data Reviewed: I have personally reviewed following labs and  imaging studies  Micro Results No results found for this or any previous visit (from the past 240 hour(s)).  Radiology Reports Dg Chest Portable 1 View  09/25/2015  CLINICAL DATA:  Central chest pain. EXAM: PORTABLE CHEST 1 VIEW COMPARISON:  July 18, 2015 FINDINGS: The heart size and mediastinal contours are within normal limits. Both lungs are clear. The visualized skeletal structures are unremarkable. IMPRESSION: No active  disease. Electronically Signed   By: Gerome Samavid  Williams III M.D   On: 09/25/2015 22:46     CBC  Recent Labs Lab 09/25/15 2245  WBC 8.6  HGB 13.5  HCT 41.8  PLT 179  MCV 82.4  MCH 26.6  MCHC 32.3  RDW 21.6*    Chemistries   Recent Labs Lab 09/25/15 2245  NA 139  K 3.4*  CL 104  CO2 28  GLUCOSE 96  BUN 14  CREATININE 0.69  CALCIUM 8.8*  AST 16  ALT 12*  ALKPHOS 70  BILITOT 0.2*   ------------------------------------------------------------------------------------------------------------------ estimated creatinine clearance is 62.2 mL/min (by C-G formula based on Cr of 0.69). ------------------------------------------------------------------------------------------------------------------ No results for input(s): HGBA1C in the last 72 hours. ------------------------------------------------------------------------------------------------------------------ No results for input(s): CHOL, HDL, LDLCALC, TRIG, CHOLHDL, LDLDIRECT in the last 72 hours. ------------------------------------------------------------------------------------------------------------------  Recent Labs  09/26/15 0051  TSH 3.983   ------------------------------------------------------------------------------------------------------------------ No results for input(s): VITAMINB12, FOLATE, FERRITIN, TIBC, IRON, RETICCTPCT in the last 72 hours.  Coagulation profile  Recent Labs Lab 09/25/15 2245  INR 1.02    No results for input(s): DDIMER in the last 72 hours.  Cardiac Enzymes  Recent Labs Lab 09/25/15 2245 09/26/15 0051 09/26/15 0621  TROPONINI <0.03 <0.03 <0.03   ------------------------------------------------------------------------------------------------------------------ Invalid input(s): POCBNP   CBG: No results for input(s): GLUCAP in the last 168 hours.     Studies: Dg Chest Portable 1 View  09/25/2015  CLINICAL DATA:  Central chest pain. EXAM: PORTABLE CHEST 1 VIEW  COMPARISON:  July 18, 2015 FINDINGS: The heart size and mediastinal contours are within normal limits. Both lungs are clear. The visualized skeletal structures are unremarkable. IMPRESSION: No active disease. Electronically Signed   By: Gerome Samavid  Williams III M.D   On: 09/25/2015 22:46      Lab Results  Component Value Date   HGBA1C 6.3* 07/19/2015   Lab Results  Component Value Date   CREATININE 0.69 09/25/2015       Scheduled Meds: . amLODipine  5 mg Oral Daily  . aspirin EC  81 mg Oral Daily  . atorvastatin  40 mg Oral Daily  . heparin  5,000 Units Subcutaneous Q8H  . lisinopril  40 mg Oral Daily  . metoprolol  100 mg Oral BID  . sodium chloride flush  3 mL Intravenous Q12H   Continuous Infusions:       Time spent: >30 MINS    Outpatient CarecenterBROL,Birtha Hatler  [facility] Pager [phone]. If 7PM-7AM, please contact night-coverage at [email or web address], password [facility] [email or web address] 09/26/2015, 9:54 AM

## 2015-09-27 DIAGNOSIS — R079 Chest pain, unspecified: Secondary | ICD-10-CM | POA: Insufficient documentation

## 2015-09-27 DIAGNOSIS — J441 Chronic obstructive pulmonary disease with (acute) exacerbation: Secondary | ICD-10-CM

## 2015-09-27 DIAGNOSIS — Z720 Tobacco use: Secondary | ICD-10-CM

## 2015-09-27 DIAGNOSIS — R072 Precordial pain: Secondary | ICD-10-CM

## 2015-09-27 DIAGNOSIS — R0789 Other chest pain: Secondary | ICD-10-CM | POA: Diagnosis not present

## 2015-09-27 DIAGNOSIS — F419 Anxiety disorder, unspecified: Secondary | ICD-10-CM | POA: Diagnosis not present

## 2015-09-27 DIAGNOSIS — J438 Other emphysema: Secondary | ICD-10-CM

## 2015-09-27 LAB — GLUCOSE, CAPILLARY
GLUCOSE-CAPILLARY: 123 mg/dL — AB (ref 65–99)
GLUCOSE-CAPILLARY: 104 mg/dL — AB (ref 65–99)

## 2015-09-27 LAB — COMPREHENSIVE METABOLIC PANEL
ALT: 13 U/L — AB (ref 14–54)
ANION GAP: 7 (ref 5–15)
AST: 16 U/L (ref 15–41)
Albumin: 4 g/dL (ref 3.5–5.0)
Alkaline Phosphatase: 80 U/L (ref 38–126)
BUN: 13 mg/dL (ref 6–20)
CHLORIDE: 103 mmol/L (ref 101–111)
CO2: 30 mmol/L (ref 22–32)
CREATININE: 0.38 mg/dL — AB (ref 0.44–1.00)
Calcium: 8.8 mg/dL — ABNORMAL LOW (ref 8.9–10.3)
GFR calc non Af Amer: >60 mL/min (ref >60)
Glucose, Bld: 136 mg/dL — ABNORMAL HIGH (ref 65–99)
POTASSIUM: 3.6 mmol/L (ref 3.5–5.1)
SODIUM: 140 mmol/L (ref 135–145)
Total Bilirubin: 0.2 mg/dL — ABNORMAL LOW (ref 0.3–1.2)
Total Protein: 6.3 g/dL — ABNORMAL LOW (ref 6.5–8.1)

## 2015-09-27 LAB — CBC
HCT: 43.3 % (ref 36.0–46.0)
Hemoglobin: 13.5 g/dL (ref 12.0–15.0)
MCH: 26.3 pg (ref 26.0–34.0)
MCHC: 31.2 g/dL (ref 30.0–36.0)
MCV: 84.2 fL (ref 78.0–100.0)
PLATELETS: 190 10*3/uL (ref 150–400)
RBC: 5.14 MIL/uL — AB (ref 3.87–5.11)
RDW: 21.2 % — ABNORMAL HIGH (ref 11.5–15.5)
WBC: 6.9 10*3/uL (ref 4.0–10.5)

## 2015-09-27 NOTE — Progress Notes (Signed)
Pt's IV catheter removed and intact. Pt's IV site clean dry and intact. Discharge instructions including medications and follow up appointments were reviewed and discussed with patient. Pt verbalized understanding of discharge instructions including medications and follow up appointments. All questions were answered and no further questions at this time. Pt in stable condition at time of discharge and in no acute distress. Pt escorted by nurse tech.

## 2015-09-27 NOTE — Progress Notes (Signed)
Subjective: No CP  No SOB   Objective: Filed Vitals:   09/26/15 1957 09/26/15 2018 09/27/15 0516 09/27/15 0800  BP:  154/53 128/66 137/68  Pulse:  64 55 63  Temp:  98 F (36.7 C) 97.6 F (36.4 C) 97.7 F (36.5 C)  TempSrc:  Oral Oral Oral  Resp:  20 21 20   Height:      Weight:      SpO2: 95% 98% 97% 95%   Weight change:   Intake/Output Summary (Last 24 hours) at 09/27/15 0950 Last data filed at 09/27/15 0852  Gross per 24 hour  Intake    246 ml  Output      0 ml  Net    246 ml    General: Alert, awake, oriented x3, in no acute distress Neck:  JVP is normal Heart: Regular rate and rhythm, without murmurs, rubs, gallops.  Lungs: Decreased air flow   Exemities:  No edema.   Neuro: Grossly intact, nonfocal.  Tele  SR   Lab Results: Results for orders placed or performed during the hospital encounter of 09/25/15 (from the past 24 hour(s))  Troponin I     Status: None   Collection Time: 09/26/15 12:31 PM  Result Value Ref Range   Troponin I <0.03 <0.03 ng/mL  Glucose, capillary     Status: Abnormal   Collection Time: 09/26/15  8:50 PM  Result Value Ref Range   Glucose-Capillary 162 (H) 65 - 99 mg/dL   Comment 1 Notify RN    Comment 2 Document in Chart   CBC     Status: Abnormal   Collection Time: 09/27/15  4:12 AM  Result Value Ref Range   WBC 6.9 4.0 - 10.5 K/uL   RBC 5.14 (H) 3.87 - 5.11 MIL/uL   Hemoglobin 13.5 12.0 - 15.0 g/dL   HCT 16.143.3 09.636.0 - 04.546.0 %   MCV 84.2 78.0 - 100.0 fL   MCH 26.3 26.0 - 34.0 pg   MCHC 31.2 30.0 - 36.0 g/dL   RDW 40.921.2 (H) 81.111.5 - 91.415.5 %   Platelets 190 150 - 400 K/uL  Comprehensive metabolic panel     Status: Abnormal   Collection Time: 09/27/15  4:12 AM  Result Value Ref Range   Sodium 140 135 - 145 mmol/L   Potassium 3.6 3.5 - 5.1 mmol/L   Chloride 103 101 - 111 mmol/L   CO2 30 22 - 32 mmol/L   Glucose, Bld 136 (H) 65 - 99 mg/dL   BUN 13 6 - 20 mg/dL   Creatinine, Ser 7.820.38 (L) 0.44 - 1.00 mg/dL   Calcium 8.8 (L) 8.9 -  10.3 mg/dL   Total Protein 6.3 (L) 6.5 - 8.1 g/dL   Albumin 4.0 3.5 - 5.0 g/dL   AST 16 15 - 41 U/L   ALT 13 (L) 14 - 54 U/L   Alkaline Phosphatase 80 38 - 126 U/L   Total Bilirubin 0.2 (L) 0.3 - 1.2 mg/dL   GFR calc non Af Amer >60 >60 mL/min   GFR calc Af Amer >60 >60 mL/min   Anion gap 7 5 - 15  Glucose, capillary     Status: Abnormal   Collection Time: 09/27/15  7:23 AM  Result Value Ref Range   Glucose-Capillary 123 (H) 65 - 99 mg/dL    Studies/Results: Ct Angio Chest Pe W Or Wo Contrast  09/26/2015  CLINICAL DATA:  Shortness of breath with lower extremity swelling starting about 3 months ago. EXAM:  CT ANGIOGRAPHY CHEST WITH CONTRAST TECHNIQUE: Multidetector CT imaging of the chest was performed using the standard protocol during bolus administration of intravenous contrast. Multiplanar CT image reconstructions and MIPs were obtained to evaluate the vascular anatomy. CONTRAST:  100 cc Isovue 370 COMPARISON:  None. FINDINGS: Mediastinum / Lymph Nodes: There is no axillary lymphadenopathy. 9 mm short axis precarinal lymph node is upper limits of normal for size. No hilar lymphadenopathy. Heart is upper normal for size. No pericardial effusion. Coronary artery calcification is noted. The esophagus has normal imaging features. There is no filling defect in the opacified pulmonary arteries to suggest the presence of an acute pulmonary embolus. No thoracic aortic aneurysm. No dissection of the thoracic aorta. Lungs / Pleura: Fine detail of lung architecture obscured by patient breathing motion. Biapical pleural-parenchymal scarring is evident. There is centrilobular and paraseptal emphysema with an upper lobe predominance. No focal airspace consolidation. No pulmonary edema or pleural effusion. Upper Abdomen:  Unremarkable. MSK / Soft Tissues: Bone windows reveal no worrisome lytic or sclerotic osseous lesions. Review of the MIP images confirms the above findings. IMPRESSION: 1. No CT evidence for  acute pulmonary embolus. 2. No acute cardiopulmonary findings. 3. Emphysema. Electronically Signed   By: Kennith Center M.D.   On: 09/26/2015 17:10   US Venous Img Lower Bilateral  09/27/2015  CLINICAL DATA:  Leg pain. EXAM: BILATERAL LOWER EXTREMITY VENOUS DOPPLER ULTRASOUND TECHNIQUE: Gray-scale sonography with graded compression, as well as color Doppler and duplex ultrasound were performed to evaluate the lower extremity deep venous systems from the level of the common femoral vein and including the common femoral, femoral, profunda femoral, popliteal and calf veins including the posterior tibial, peroneal and gastrocnemius veins when visible. The superficial great saphenous vein was also interrogated. Spectral Doppler was utilized to evaluate flow at rest and with distal augmentation maneuvers in the common femoral, femoral and popliteal veins. COMPARISON:  09/20/1998. FINDINGS: RIGHT LOWER EXTREMITY Common Femoral Vein: No evidence of thrombus. Normal compressibility, respiratory phasicity and response to augmentation. Saphenofemoral Junction: No evidence of thrombus. Normal compressibility and flow on color Doppler imaging. Profunda Femoral Vein: No evidence of thrombus. Normal compressibility and flow on color Doppler imaging. Femoral Vein: No evidence of thrombus. Normal compressibility, respiratory phasicity and response to augmentation. Popliteal Vein: No evidence of thrombus. Normal compressibility, respiratory phasicity and response to augmentation. Calf Veins: No evidence of thrombus. Normal compressibility and flow on color Doppler imaging. Interval resolution of right calf pain deep venous thrombus. Superficial Great Saphenous Vein: No evidence of thrombus. Normal compressibility and flow on color Doppler imaging. Other Findings:  None. LEFT LOWER EXTREMITY Common Femoral Vein: No evidence of thrombus. Normal compressibility, respiratory phasicity and response to augmentation. Saphenofemoral  Junction: No evidence of thrombus. Normal compressibility and flow on color Doppler imaging. Profunda Femoral Vein: No evidence of thrombus. Normal compressibility and flow on color Doppler imaging. Femoral Vein: No evidence of thrombus. Normal compressibility, respiratory phasicity and response to augmentation. Popliteal Vein: No evidence of thrombus. Normal compressibility, respiratory phasicity and response to augmentation. Calf Veins: No evidence of thrombus. Normal compressibility and flow on color Doppler imaging. Superficial Great Saphenous Vein: No evidence of thrombus. Normal compressibility and flow on color Doppler imaging. Other Findings:  None. IMPRESSION: No evidence of deep venous thrombosis. Electronically Signed   By: Maisie Fus  Register   On: 09/27/2015 06:51    Medications: Reviewed    @  1  CP  Atypical  I do not think cardiac in origin  Curr without symptoms Echo yesterday LVEF wa normal  No signif valve dz CT neg for PE  Emphysema I would not pursue any further cardiac testing     2.  COPD  Counselled on smoking cessation.      Katherine Romero 09/27/2015, 9:50 AM

## 2015-09-27 NOTE — Progress Notes (Signed)
SATURATION QUALIFICATIONS: (This note is used to comply with regulatory documentation for home oxygen)  Patient Saturations on Room Air at Rest = 99 %  Patient Saturations on Room Air while Ambulating = 98 %   

## 2015-09-27 NOTE — Discharge Summary (Addendum)
Physician Discharge Summary  Katherine Romero MRN: [id number] DOB/AGE: 1951/01/21 65 y.o.  PCP: Monico Blitz, MD   Admit date: 09/25/2015 Discharge date: 09/27/2015  Discharge Diagnoses:     Principal Problem:   Atypical chest pain Active Problems:   Depression   Tobacco abuse   COPD (chronic obstructive pulmonary disease) (HCC)   Anxiety   Pain in the chest   Chest pain    Follow-up recommendations Follow-up with PCP in 3-5 days , including all  additional recommended appointments as below Follow-up CBC, CMP in 3-5 days       Current Discharge Medication List    CONTINUE these medications which have NOT CHANGED   Details  albuterol (PROVENTIL) (2.5 MG/3ML) 0.083% nebulizer solution INHALE ONE VIAL IN NEBULIZER DAILY AS NEEDED FOR WHEEZING-SHORTNESS OF BREATH Refills: 3    Albuterol Sulfate (PROAIR RESPICLICK) 681 (90 Base) MCG/ACT AEPB Inhale 2 puffs into the lungs every 6 (six) hours as needed (for wheezing/shortness of breath).     amLODipine (NORVASC) 5 MG tablet Take 5 mg by mouth daily. Refills: 3    aspirin EC 81 MG tablet Take 81 mg by mouth daily.    atorvastatin (LIPITOR) 40 MG tablet Take 40 mg by mouth daily. Refills: 3    lisinopril (PRINIVIL,ZESTRIL) 40 MG tablet Take 40 mg by mouth daily. Refills: 3    metoprolol (LOPRESSOR) 100 MG tablet Take 100 mg by mouth 2 (two) times daily.    polyethylene glycol powder (GLYCOLAX/MIRALAX) powder Take 17 g by mouth daily.    ferrous sulfate 325 (65 FE) MG tablet Take 1 tablet (325 mg total) by mouth every other day. Qty: 30 tablet, Refills: 0    senna-docusate (SENOKOT-S) 8.6-50 MG tablet Take 1 tablet by mouth at bedtime. Qty: 10 tablet, Refills: 0      STOP taking these medications     Aspirin-Salicylamide-Caffeine (BC HEADACHE) 325-95-16 MG TABS      doxycycline (VIBRAMYCIN) 100 MG capsule      predniSONE (DELTASONE) 10 MG tablet          Discharge Condition: Stable Discharge  Instructions Get Medicines reviewed and adjusted: Please take all your medications with you for your next visit with your Primary MD  Please request your Primary MD to go over all hospital tests and procedure/radiological results at the follow up, please ask your Primary MD to get all Hospital records sent to his/her office.  If you experience worsening of your admission symptoms, develop shortness of breath, life threatening emergency, suicidal or homicidal thoughts you must seek medical attention immediately by calling 911 or calling your MD immediately if symptoms less severe.  You must read complete instructions/literature along with all the possible adverse reactions/side effects for all the Medicines you take and that have been prescribed to you. Take any new Medicines after you have completely understood and accpet all the possible adverse reactions/side effects.   Do not drive when taking Pain medications.   Do not take more than prescribed Pain, Sleep and Anxiety Medications  Special Instructions: If you have smoked or chewed Tobacco in the last 2 yrs please stop smoking, stop any regular Alcohol and or any Recreational drug use.  Wear Seat belts while driving.  Please note  You were cared for by a hospitalist during your hospital stay. Once you are discharged, your primary care physician will handle any further medical issues. Please note that NO REFILLS for any discharge medications will be authorized once you are discharged, as it  is imperative that you return to your primary care physician (or establish a relationship with a primary care physician if you do not have one) for your aftercare needs so that they can reassess your need for medications and monitor your lab values.  Discharge Instructions    Diet - low sodium heart healthy    Complete by:  As directed      Increase activity slowly    Complete by:  As directed             No Known Allergies    Disposition:  01-Home or Self Care   Consults:  Cardiology     Significant Diagnostic Studies:  Ct Angio Chest Pe W Or Wo Contrast  09/26/2015  CLINICAL DATA:  Shortness of breath with lower extremity swelling starting about 3 months ago. EXAM: CT ANGIOGRAPHY CHEST WITH CONTRAST TECHNIQUE: Multidetector CT imaging of the chest was performed using the standard protocol during bolus administration of intravenous contrast. Multiplanar CT image reconstructions and MIPs were obtained to evaluate the vascular anatomy. CONTRAST:  100 cc Isovue 370 COMPARISON:  None. FINDINGS: Mediastinum / Lymph Nodes: There is no axillary lymphadenopathy. 9 mm short axis precarinal lymph node is upper limits of normal for size. No hilar lymphadenopathy. Heart is upper normal for size. No pericardial effusion. Coronary artery calcification is noted. The esophagus has normal imaging features. There is no filling defect in the opacified pulmonary arteries to suggest the presence of an acute pulmonary embolus. No thoracic aortic aneurysm. No dissection of the thoracic aorta. Lungs / Pleura: Fine detail of lung architecture obscured by patient breathing motion. Biapical pleural-parenchymal scarring is evident. There is centrilobular and paraseptal emphysema with an upper lobe predominance. No focal airspace consolidation. No pulmonary edema or pleural effusion. Upper Abdomen:  Unremarkable. MSK / Soft Tissues: Bone windows reveal no worrisome lytic or sclerotic osseous lesions. Review of the MIP images confirms the above findings. IMPRESSION: 1. No CT evidence for acute pulmonary embolus. 2. No acute cardiopulmonary findings. 3. Emphysema. Electronically Signed   By: Misty Stanley M.D.   On: 09/26/2015 17:10   US Venous Img Lower Bilateral  09/27/2015  CLINICAL DATA:  Leg pain. EXAM: BILATERAL LOWER EXTREMITY VENOUS DOPPLER ULTRASOUND TECHNIQUE: Gray-scale sonography with graded compression, as well as color Doppler and duplex ultrasound were  performed to evaluate the lower extremity deep venous systems from the level of the common femoral vein and including the common femoral, femoral, profunda femoral, popliteal and calf veins including the posterior tibial, peroneal and gastrocnemius veins when visible. The superficial great saphenous vein was also interrogated. Spectral Doppler was utilized to evaluate flow at rest and with distal augmentation maneuvers in the common femoral, femoral and popliteal veins. COMPARISON:  09/20/1998. FINDINGS: RIGHT LOWER EXTREMITY Common Femoral Vein: No evidence of thrombus. Normal compressibility, respiratory phasicity and response to augmentation. Saphenofemoral Junction: No evidence of thrombus. Normal compressibility and flow on color Doppler imaging. Profunda Femoral Vein: No evidence of thrombus. Normal compressibility and flow on color Doppler imaging. Femoral Vein: No evidence of thrombus. Normal compressibility, respiratory phasicity and response to augmentation. Popliteal Vein: No evidence of thrombus. Normal compressibility, respiratory phasicity and response to augmentation. Calf Veins: No evidence of thrombus. Normal compressibility and flow on color Doppler imaging. Interval resolution of right calf pain deep venous thrombus. Superficial Great Saphenous Vein: No evidence of thrombus. Normal compressibility and flow on color Doppler imaging. Other Findings:  None. LEFT LOWER EXTREMITY Common Femoral Vein: No evidence of thrombus.  Normal compressibility, respiratory phasicity and response to augmentation. Saphenofemoral Junction: No evidence of thrombus. Normal compressibility and flow on color Doppler imaging. Profunda Femoral Vein: No evidence of thrombus. Normal compressibility and flow on color Doppler imaging. Femoral Vein: No evidence of thrombus. Normal compressibility, respiratory phasicity and response to augmentation. Popliteal Vein: No evidence of thrombus. Normal compressibility, respiratory  phasicity and response to augmentation. Calf Veins: No evidence of thrombus. Normal compressibility and flow on color Doppler imaging. Superficial Great Saphenous Vein: No evidence of thrombus. Normal compressibility and flow on color Doppler imaging. Other Findings:  None. IMPRESSION: No evidence of deep venous thrombosis. Electronically Signed   By: Marcello Moores  Register   On: 09/27/2015 06:51   Dg Chest Portable 1 View  09/25/2015  CLINICAL DATA:  Central chest pain. EXAM: PORTABLE CHEST 1 VIEW COMPARISON:  July 18, 2015 FINDINGS: The heart size and mediastinal contours are within normal limits. Both lungs are clear. The visualized skeletal structures are unremarkable. IMPRESSION: No active disease. Electronically Signed   By: Dorise Bullion III M.D   On: 09/25/2015 22:46    Echocardiogram LV EF: 60% - 65%  ------------------------------------------------------------------- Indications: Chest pain 786.51.  ------------------------------------------------------------------- History: PMH: Chronic obstructive pulmonary disease. Risk factors: Current tobacco use.  ------------------------------------------------------------------- Study Conclusions  - Left ventricle: The cavity size was normal. Wall thickness was  increased in a pattern of mild LVH. Systolic function was normal.  The estimated ejection fraction was in the range of 60% to 65%.  Wall motion was normal; there were no regional wall motion  abnormalities. Features are consistent with a pseudonormal left  ventricular filling pattern, with concomitant abnormal relaxation  and increased filling pressure (grade 2 diastolic dysfunction).  Doppler parameters are consistent with high ventricular filling  pressure. - Aortic valve: Mildly calcified annulus. Trileaflet; mildly  thickened leaflets. Valve area (VTI): 1.66 cm^2. Valve area  (Vmax): 1.7 cm^2. - Mitral valve: Moderately calcified annulus. Mildly  thickened  leaflets . There was mild regurgitation. - Left atrium: The atrium was moderately dilated. - Technically adequate study.   Filed Weights   09/26/15 0039  Weight: 62.687 kg (138 lb 3.2 oz)     Microbiology: No results found for this or any previous visit (from the past 240 hour(s)).     Blood Culture No results found for: SDES, SPECREQUEST, CULT, REPTSTATUS    Labs: Results for orders placed or performed during the hospital encounter of 09/25/15 (from the past 48 hour(s))  APTT     Status: None   Collection Time: 09/25/15 10:45 PM  Result Value Ref Range   aPTT 36 24 - 37 seconds  CBC     Status: Abnormal   Collection Time: 09/25/15 10:45 PM  Result Value Ref Range   WBC 8.6 4.0 - 10.5 K/uL   RBC 5.07 3.87 - 5.11 MIL/uL   Hemoglobin 13.5 12.0 - 15.0 g/dL   HCT 41.8 36.0 - 46.0 %   MCV 82.4 78.0 - 100.0 fL   MCH 26.6 26.0 - 34.0 pg   MCHC 32.3 30.0 - 36.0 g/dL   RDW 21.6 (H) 11.5 - 15.5 %   Platelets 179 150 - 400 K/uL    Comment: REPEATED TO VERIFY SPECIMEN CHECKED FOR CLOTS SMEAR STAINED AND AVAILABLE FOR REVIEW PLATELETS APPEAR ADEQUATE   Comprehensive metabolic panel     Status: Abnormal   Collection Time: 09/25/15 10:45 PM  Result Value Ref Range   Sodium 139 135 - 145 mmol/L   Potassium 3.4 (L)  3.5 - 5.1 mmol/L   Chloride 104 101 - 111 mmol/L   CO2 28 22 - 32 mmol/L   Glucose, Bld 96 65 - 99 mg/dL   BUN 14 6 - 20 mg/dL   Creatinine, Ser 0.69 0.44 - 1.00 mg/dL   Calcium 8.8 (L) 8.9 - 10.3 mg/dL   Total Protein 6.8 6.5 - 8.1 g/dL   Albumin 4.2 3.5 - 5.0 g/dL   AST 16 15 - 41 U/L   ALT 12 (L) 14 - 54 U/L   Alkaline Phosphatase 70 38 - 126 U/L   Total Bilirubin 0.2 (L) 0.3 - 1.2 mg/dL   GFR calc non Af Amer >60 >60 mL/min   GFR calc Af Amer >60 >60 mL/min    Comment: (NOTE) The eGFR has been calculated using the CKD EPI equation. This calculation has not been validated in all clinical situations. eGFR's persistently <60 mL/min signify  possible Chronic Kidney Disease.    Anion gap 7 5 - 15  Protime-INR     Status: None   Collection Time: 09/25/15 10:45 PM  Result Value Ref Range   Prothrombin Time 13.6 11.6 - 15.2 seconds   INR 1.02 0.00 - 1.49  Troponin I     Status: None   Collection Time: 09/25/15 10:45 PM  Result Value Ref Range   Troponin I <0.03 <0.03 ng/mL  TSH     Status: None   Collection Time: 09/26/15 12:51 AM  Result Value Ref Range   TSH 3.983 0.350 - 4.500 uIU/mL  Troponin I     Status: None   Collection Time: 09/26/15 12:51 AM  Result Value Ref Range   Troponin I <0.03 <0.03 ng/mL  Troponin I     Status: None   Collection Time: 09/26/15  6:21 AM  Result Value Ref Range   Troponin I <0.03 <0.03 ng/mL  D-dimer, quantitative (not at [facility])     Status: Abnormal   Collection Time: 09/26/15  9:23 AM  Result Value Ref Range   D-Dimer, Quant 2.49 (H) 0.00 - 0.50 ug/mL-FEU    Comment: (NOTE) At the manufacturer cut-off of 0.50 ug/mL FEU, this assay has been documented to exclude PE with a sensitivity and negative predictive value of 97 to 99%.  At this time, this assay has not been approved by the FDA to exclude DVT/VTE. Results should be correlated with clinical presentation.   Troponin I     Status: None   Collection Time: 09/26/15 12:31 PM  Result Value Ref Range   Troponin I <0.03 <0.03 ng/mL  Glucose, capillary     Status: Abnormal   Collection Time: 09/26/15  8:50 PM  Result Value Ref Range   Glucose-Capillary 162 (H) 65 - 99 mg/dL   Comment 1 Notify RN    Comment 2 Document in Chart   CBC     Status: Abnormal   Collection Time: 09/27/15  4:12 AM  Result Value Ref Range   WBC 6.9 4.0 - 10.5 K/uL   RBC 5.14 (H) 3.87 - 5.11 MIL/uL   Hemoglobin 13.5 12.0 - 15.0 g/dL   HCT 43.3 36.0 - 46.0 %   MCV 84.2 78.0 - 100.0 fL   MCH 26.3 26.0 - 34.0 pg   MCHC 31.2 30.0 - 36.0 g/dL   RDW 21.2 (H) 11.5 - 15.5 %   Platelets 190 150 - 400 K/uL  Comprehensive metabolic panel     Status: Abnormal    Collection Time: 09/27/15  4:12 AM  Result Value Ref Range   Sodium 140 135 - 145 mmol/L   Potassium 3.6 3.5 - 5.1 mmol/L   Chloride 103 101 - 111 mmol/L   CO2 30 22 - 32 mmol/L   Glucose, Bld 136 (H) 65 - 99 mg/dL   BUN 13 6 - 20 mg/dL   Creatinine, Ser 0.38 (L) 0.44 - 1.00 mg/dL   Calcium 8.8 (L) 8.9 - 10.3 mg/dL   Total Protein 6.3 (L) 6.5 - 8.1 g/dL   Albumin 4.0 3.5 - 5.0 g/dL   AST 16 15 - 41 U/L   ALT 13 (L) 14 - 54 U/L   Alkaline Phosphatase 80 38 - 126 U/L   Total Bilirubin 0.2 (L) 0.3 - 1.2 mg/dL   GFR calc non Af Amer >60 >60 mL/min   GFR calc Af Amer >60 >60 mL/min    Comment: (NOTE) The eGFR has been calculated using the CKD EPI equation. This calculation has not been validated in all clinical situations. eGFR's persistently <60 mL/min signify possible Chronic Kidney Disease.    Anion gap 7 5 - 15  Glucose, capillary     Status: Abnormal   Collection Time: 09/27/15  7:23 AM  Result Value Ref Range   Glucose-Capillary 123 (H) 65 - 99 mg/dL     Lipid Panel  No results found for: CHOL, TRIG, HDL, CHOLHDL, VLDL, LDLCALC, LDLDIRECT   Lab Results  Component Value Date   HGBA1C 6.3* 07/19/2015     Lab Results  Component Value Date   CREATININE 0.38* 09/27/2015     HPI :* Katherine Romero is a 65 y.o.female history of COPD, DM2, DVT 2011. She thinks she had an MI at age 61 but is unclear of specifics, does not think she had a stent. States she was told she had CHF by her pcp when she had some LE edema, she is unsure of specifics and if she has had an echo before. She is admitted with chest pain. Episode at rest last night. 8/10 tightness in epigastric area with diaphoresis and dizziness, lasted approx 30 minutes. She has had similar pain on and off for several years but not this intense. She reports chronic DOE at <1/2 block that is unchanged. Intermittent LE edema. ED Course: Potassium 3.4, CXR no acute disease and troponin negative. Due to her cardiac risk factors  she has been referred for admission   HOSPITAL COURSE: *  Atypical chest pain-cardiac enzymes negative thus, multiple risk factors including smoking, diabetes, hypertension   consulted cardiology for further evaluation, they felt the patient's chest pain was atypical, not classic for PE, recommended to rule out PE given her elevated d-dimer, history of DVT in 2011 CTA of the chest and venous Doppler was negative for PE and DVT Continue aspirin, beta blocker, statin  2-D echo with results as above As per Dr. Roderic Palau branch no indication to pursue further cardiac workup at this time  COPD without exacerbation -continue prn nebulizer treatments,   Essential hypertension -ACE inhibitor, metoprolol   Diabetes mellitus without complications-uncontrolled, most recent A1c 6.3,   Dyslipidemia -continue statin   Depression    Discharge Exam:    Blood pressure 137/68, pulse 63, temperature 97.7 F (36.5 C), temperature source Oral, resp. rate 20, height '5\' 4"'$  (1.626 m), weight 62.687 kg (138 lb 3.2 oz), SpO2 95 %.  General exam: Appears calm and comfortable  Respiratory system: Clear to auscultation. Respiratory effort normal. Cardiovascular system: S1 & S2 heard, RRR. No JVD, murmurs,  rubs, gallops or clicks. No pedal edema. Gastrointestinal system: Abdomen is nondistended, soft and nontender. No organomegaly or masses felt. Normal bowel sounds heard. Central nervous system: Alert and oriented. No focal neurological deficits. Extremities: Symmetric 5 x 5 power. Skin: No rashes, lesions or ulcers Psychiatry: Judgement and insight appear normal. Mood & affect appropriate    Follow-up Information    Follow up with [facility], MD. Schedule an appointment as soon as possible for a visit in 1 week.   Specialty:  Internal Medicine   Contact information:   [place] [address] [phone]       Signed: Reyne Dumas 09/27/2015, 10:55 AM        Time spent >45 mins

## 2015-09-27 NOTE — Care Management Note (Signed)
Case Management Note  Patient Details  Name: Katherine IgoDeborah R Narine MRN: [id number] Date of Birth: 1951-03-31  Subjective/Objective: Patient is from home with husband. Reports independence with ADL's. [facility]. She is showing in our system to have medicaid, however patient is reporting that she does not. [facility].                  Action/Plan: No CM needs identified.    Expected Discharge Date:  09/27/15               Expected Discharge Plan:  Home/Self Care  In-House Referral:  Financial Counselor  Discharge planning Services  CM Consult  Post Acute Care Choice:  NA Choice offered to:  NA  DME Arranged:    DME Agency:     HH Arranged:    HH Agency:     Status of Service:  Completed, signed off  If discussed at [employer] Length of Stay Meetings, dates discussed:    Additional Comments:  Okechukwu Regnier, Chrystine OilerSharley Diane, RN 09/27/2015, 10:58 AM

## 2017-05-29 NOTE — Patient Instructions (Signed)
Katherine IgoDeborah R Romero  05/29/2017     @PREFPERIOPPHARMACY @   Your procedure is scheduled on 06/08/2017.  [facility] to [facility] at 1200 PM.  Call this number if you have problems the morning of surgery:  [phone]   Remember:  Do not eat food or drink liquids after midnight.  Take these medicines the morning of surgery with A SIP OF WATER Albuterol neb, Amlodipine, Abilify, Metoprolol, Prilosec, Effexor   Do not wear jewelry, make-up or nail polish.  Do not wear lotions, powders, or perfumes, or deodorant.  Do not shave 48 hours prior to surgery.  Men may shave face and neck.  Do not bring valuables to the hospital.  [facility] is not responsible for any belongings or valuables.  Contacts, dentures or bridgework may not be worn into surgery.  Leave your suitcase in the car.  After surgery it may be brought to your room.  For patients admitted to the hospital, discharge time will be determined by your treatment team.  Patients discharged the day of surgery will not be allowed to drive home.    Please read over the following fact sheets that you were given. Anesthesia Post-op Instructions     PATIENT INSTRUCTIONS POST-ANESTHESIA  IMMEDIATELY FOLLOWING SURGERY:  Do not drive or operate machinery for the first twenty four hours after surgery.  Do not make any important decisions for twenty four hours after surgery or while taking narcotic pain medications or sedatives.  If you develop intractable nausea and vomiting or a severe headache please notify your doctor immediately.  FOLLOW-UP:  Please make an appointment with your surgeon as instructed. You do not need to follow up with anesthesia unless specifically instructed to do so.  WOUND CARE INSTRUCTIONS (if applicable):  Keep a dry clean dressing on the anesthesia/puncture wound site if there is drainage.  Once the wound has quit draining you may leave it open to air.  Generally you should leave the bandage intact for twenty four  hours unless there is drainage.  If the epidural site drains for more than 36-48 hours please call the anesthesia department.  QUESTIONS?:  Please feel free to call your physician or the hospital operator if you have any questions, and they will be happy to assist you.      Cataract Surgery Cataract surgery is a procedure to remove a cataract from your eye. A cataract is cloudiness on the lens of your eye. The lens focuses light inside the eye. When a lens becomes cloudy, your vision is affected. Cataract surgery is a procedure to remove the cloudy lens. A substitute lens (intraocular lens or IOL) is usually inserted as a replacement for the cloudy lens. Tell a health care provider about:  Any allergies you have.  All medicines you are taking, including vitamins, herbs, eye drops, creams, and over-the-counter medicines.  Any problems you or family members have had with anesthetic medicines.  Any blood disorders you have.  Any surgeries you have had, especially eye surgeries that include refractive surgery, such as PRK and LASIK.  Any medical conditions you have.  Whether you are pregnant or may be pregnant. What are the risks? Generally, this is a safe procedure. However, problems may occur, including:  Infection.  Bleeding.  Glaucoma.  Retinal detachment.  Allergic reactions to medicines.  Damage to other structures or organs.  Inflammation of the eye.  Clouding of the part of your eye that holds an IOL in place (after-cataract), if an IOL was  inserted. This is fairly common.  An IOL moving out of position, if an IOL was inserted. This is very rare.  Loss of vision. This is rare.  What happens before the procedure?  Follow instructions from your health care provider about eating or drinking restrictions.  Ask your health care provider about: ? Changing or stopping your regular medicines, including any eye drops you have been prescribed. This is especially important  if you are taking diabetes medicines or blood thinners. ? Taking medicines such as aspirin and ibuprofen. These medicines can thin your blood. Do not take these medicines before your procedure if your health care provider instructs you not to.  Do not put contact lenses in either eye on the day of your surgery.  Plan for someone to drive you to and from the procedure.  If you will be going home right after the procedure, plan to have someone with you for 24 hours. What happens during the procedure?  An IV tube may be inserted into one of your veins.  You will be given one or more of the following: ? A medicine to help you relax (sedative). ? A medicine to numb the area (local anesthetic). This may be numbing eye drops or an injection that is given behind the eye.  A small cut (incision) will be made to the edge of the clear, dome-shaped surface that covers the front of the eye (cornea).  A small probe will be inserted into the eye. This device gives off ultrasound waves that soften and break up the cloudy center of the lens. This makes it easier for the cloudy lens to be removed by suction.  An IOL may be implanted.  Part of the capsule that surrounds the lens will be left in the eye to support the IOL.  Your surgeon may use stitches (sutures) to close the incision. The procedure may vary among health care providers and hospitals. What happens after the procedure?  Your blood pressure, heart rate, breathing rate, and blood oxygen level will be monitored often until the medicines you were given have worn off.  You may be given a protective shield to wear over your eyes.  Do not drive for 24 hours if you received a sedative. This information is not intended to replace advice given to you by your health care provider. Make sure you discuss any questions you have with your health care provider. Document Released: 03/06/2011 Document Revised: 08/23/2015 Document Reviewed:  01/25/2015 Elsevier Interactive Patient Education  [employer].

## 2017-06-02 ENCOUNTER — Encounter (HOSPITAL_COMMUNITY)
Admission: RE | Admit: 2017-06-02 | Discharge: 2017-06-02 | Disposition: A | Discharge status: Home / Self Care | Payer: Medicare Other | Source: Ambulatory Visit | Attending: Ophthalmology | Admitting: Ophthalmology

## 2017-06-02 ENCOUNTER — Other Ambulatory Visit: Payer: Self-pay

## 2017-06-02 ENCOUNTER — Encounter (HOSPITAL_COMMUNITY): Payer: Self-pay

## 2017-06-02 DIAGNOSIS — Z01810 Encounter for preprocedural cardiovascular examination: Secondary | ICD-10-CM | POA: Insufficient documentation

## 2017-06-02 DIAGNOSIS — Z01812 Encounter for preprocedural laboratory examination: Secondary | ICD-10-CM | POA: Insufficient documentation

## 2017-06-02 HISTORY — DX: Gastro-esophageal reflux disease without esophagitis: K21.9

## 2017-06-02 HISTORY — DX: Anemia, unspecified: D64.9

## 2017-06-02 HISTORY — DX: Unspecified osteoarthritis, unspecified site: M19.90

## 2017-06-02 LAB — CBC WITH DIFFERENTIAL/PLATELET
BASOS ABS: 0 10*3/uL (ref 0.0–0.1)
BASOS PCT: 0 %
EOS ABS: 0.1 10*3/uL (ref 0.0–0.7)
Eosinophils Relative: 2 %
HEMATOCRIT: 42.5 % (ref 36.0–46.0)
Hemoglobin: 13.4 g/dL (ref 12.0–15.0)
Lymphocytes Relative: 23 %
Lymphs Abs: 1.8 10*3/uL (ref 0.7–4.0)
MCH: 28 pg (ref 26.0–34.0)
MCHC: 31.5 g/dL (ref 30.0–36.0)
MCV: 88.7 fL (ref 78.0–100.0)
MONO ABS: 0.5 10*3/uL (ref 0.1–1.0)
Monocytes Relative: 6 %
NEUTROS ABS: 5.5 10*3/uL (ref 1.7–7.7)
Neutrophils Relative %: 69 %
PLATELETS: 239 10*3/uL (ref 150–400)
RBC: 4.79 MIL/uL (ref 3.87–5.11)
RDW: 14.4 % (ref 11.5–15.5)
WBC: 7.9 10*3/uL (ref 4.0–10.5)

## 2017-06-02 LAB — BASIC METABOLIC PANEL
ANION GAP: 12 (ref 5–15)
BUN: 14 mg/dL (ref 6–20)
CO2: 24 mmol/L (ref 22–32)
Calcium: 9.7 mg/dL (ref 8.9–10.3)
Chloride: 103 mmol/L (ref 101–111)
Creatinine, Ser: 0.38 mg/dL — ABNORMAL LOW (ref 0.44–1.00)
GFR calc non Af Amer: >60 mL/min (ref >60)
GLUCOSE: 122 mg/dL — AB (ref 65–99)
Potassium: 3.8 mmol/L (ref 3.5–5.1)
SODIUM: 139 mmol/L (ref 135–145)

## 2017-06-02 LAB — GLUCOSE, CAPILLARY: Glucose-Capillary: 145 mg/dL — ABNORMAL HIGH (ref 65–99)

## 2017-06-02 LAB — HEMOGLOBIN A1C
Hgb A1c MFr Bld: 6.8 % — ABNORMAL HIGH (ref 4.8–5.6)
Mean Plasma Glucose: 148.46 mg/dL

## 2017-06-08 ENCOUNTER — Ambulatory Visit (HOSPITAL_COMMUNITY)
Admission: RE | Admit: 2017-06-08 | Discharge: 2017-06-08 | Disposition: A | Discharge status: Home / Self Care | Payer: Medicare Other | Source: Ambulatory Visit | Attending: Ophthalmology | Admitting: Ophthalmology

## 2017-06-08 ENCOUNTER — Ambulatory Visit (HOSPITAL_COMMUNITY): Payer: Medicare Other | Admitting: Anesthesiology

## 2017-06-08 ENCOUNTER — Encounter (HOSPITAL_COMMUNITY)
Admission: RE | Disposition: A | Discharge status: Home / Self Care | Payer: Self-pay | Source: Ambulatory Visit | Attending: Ophthalmology

## 2017-06-08 ENCOUNTER — Encounter (HOSPITAL_COMMUNITY): Payer: Self-pay | Admitting: Anesthesiology

## 2017-06-08 DIAGNOSIS — H2512 Age-related nuclear cataract, left eye: Secondary | ICD-10-CM | POA: Diagnosis not present

## 2017-06-08 DIAGNOSIS — I10 Essential (primary) hypertension: Secondary | ICD-10-CM | POA: Insufficient documentation

## 2017-06-08 DIAGNOSIS — F17200 Nicotine dependence, unspecified, uncomplicated: Secondary | ICD-10-CM | POA: Diagnosis not present

## 2017-06-08 DIAGNOSIS — E119 Type 2 diabetes mellitus without complications: Secondary | ICD-10-CM | POA: Diagnosis not present

## 2017-06-08 DIAGNOSIS — J449 Chronic obstructive pulmonary disease, unspecified: Secondary | ICD-10-CM | POA: Diagnosis not present

## 2017-06-08 HISTORY — PX: CATARACT EXTRACTION W/PHACO: SHX586

## 2017-06-08 LAB — GLUCOSE, CAPILLARY: Glucose-Capillary: 137 mg/dL — ABNORMAL HIGH (ref 65–99)

## 2017-06-08 SURGERY — PHACOEMULSIFICATION, CATARACT, WITH IOL INSERTION
Anesthesia: Monitor Anesthesia Care | Site: Eye | Laterality: Left

## 2017-06-08 MED ORDER — FENTANYL CITRATE (PF) 100 MCG/2ML IJ SOLN
25.0000 ug | Freq: Once | INTRAMUSCULAR | Status: AC
  Administered 2017-06-08: 25 ug via INTRAVENOUS

## 2017-06-08 MED ORDER — NEOMYCIN-POLYMYXIN-DEXAMETH 3.5-10000-0.1 OP SUSP
OPHTHALMIC | Status: DC | PRN
  Administered 2017-06-08: 2 [drp] via OPHTHALMIC

## 2017-06-08 MED ORDER — POVIDONE-IODINE 5 % OP SOLN
OPHTHALMIC | Status: DC | PRN
  Administered 2017-06-08: 1 via OPHTHALMIC

## 2017-06-08 MED ORDER — CYCLOPENTOLATE-PHENYLEPHRINE 0.2-1 % OP SOLN
1.0000 [drp] | OPHTHALMIC | Status: AC
  Administered 2017-06-08 (×3): 1 [drp] via OPHTHALMIC

## 2017-06-08 MED ORDER — PROVISC 10 MG/ML IO SOLN
INTRAOCULAR | Status: DC | PRN
  Administered 2017-06-08: 0.85 mL via INTRAOCULAR

## 2017-06-08 MED ORDER — MIDAZOLAM HCL 2 MG/2ML IJ SOLN
INTRAMUSCULAR | Status: AC
  Filled 2017-06-08: qty 2

## 2017-06-08 MED ORDER — FENTANYL CITRATE (PF) 100 MCG/2ML IJ SOLN
INTRAMUSCULAR | Status: AC
  Filled 2017-06-08: qty 2

## 2017-06-08 MED ORDER — EPINEPHRINE PF 1 MG/ML IJ SOLN
INTRAOCULAR | Status: DC | PRN
  Administered 2017-06-08: 500 mL

## 2017-06-08 MED ORDER — LACTATED RINGERS IV SOLN
INTRAVENOUS | Status: DC
  Administered 2017-06-08: 12:00:00 via INTRAVENOUS

## 2017-06-08 MED ORDER — LIDOCAINE HCL (PF) 1 % IJ SOLN
INTRAMUSCULAR | Status: DC | PRN
  Administered 2017-06-08: .5 mL

## 2017-06-08 MED ORDER — MIDAZOLAM HCL 2 MG/2ML IJ SOLN
1.0000 mg | INTRAMUSCULAR | Status: AC
  Administered 2017-06-08: 2 mg via INTRAVENOUS

## 2017-06-08 MED ORDER — PHENYLEPHRINE HCL 2.5 % OP SOLN
1.0000 [drp] | OPHTHALMIC | Status: AC
  Administered 2017-06-08 (×3): 1 [drp] via OPHTHALMIC

## 2017-06-08 MED ORDER — LIDOCAINE HCL 3.5 % OP GEL
1.0000 "application " | Freq: Once | OPHTHALMIC | Status: AC
  Administered 2017-06-08: 1 via OPHTHALMIC

## 2017-06-08 MED ORDER — TETRACAINE HCL 0.5 % OP SOLN
1.0000 [drp] | OPHTHALMIC | Status: AC
  Administered 2017-06-08 (×3): 1 [drp] via OPHTHALMIC

## 2017-06-08 MED ORDER — BSS IO SOLN
INTRAOCULAR | Status: DC | PRN
  Administered 2017-06-08: 15 mL

## 2017-06-08 SURGICAL SUPPLY — 13 items
CLOTH BEACON ORANGE TIMEOUT ST (SAFETY) ×3 IMPLANT
EYE SHIELD UNIVERSAL CLEAR (GAUZE/BANDAGES/DRESSINGS) ×3 IMPLANT
GLOVE BIOGEL PI IND STRL 6.5 (GLOVE) ×1 IMPLANT
GLOVE BIOGEL PI IND STRL 7.0 (GLOVE) ×1 IMPLANT
GLOVE BIOGEL PI INDICATOR 6.5 (GLOVE) ×2
GLOVE BIOGEL PI INDICATOR 7.0 (GLOVE) ×2
LENS ALC ACRYL/TECN (Ophthalmic Related) ×3 IMPLANT
PAD ARMBOARD 7.5X6 YLW CONV (MISCELLANEOUS) ×3 IMPLANT
RETRACTOR IRIS SIGHTPATH (OPHTHALMIC RELATED) ×3 IMPLANT
SYRINGE LUER LOK 1CC (MISCELLANEOUS) ×3 IMPLANT
TAPE SURG TRANSPORE 1 IN (GAUZE/BANDAGES/DRESSINGS) ×1 IMPLANT
TAPE SURGICAL TRANSPORE 1 IN (GAUZE/BANDAGES/DRESSINGS) ×2
WATER STERILE IRR 250ML POUR (IV SOLUTION) ×3 IMPLANT

## 2017-06-08 NOTE — Transfer of Care (Signed)
Immediate Anesthesia Transfer of Care Note  Patient: Katherine Romero  Procedure(s) Performed: CATARACT EXTRACTION PHACO AND INTRAOCULAR LENS PLACEMENT ([facility]) (Left Eye)  Patient Location: Short Stay  Anesthesia Type:MAC  Level of Consciousness: awake, alert , oriented and patient cooperative  Airway & Oxygen Therapy: Patient Spontanous Breathing  Post-op Assessment: Report given to RN and Post -op Vital signs reviewed and stable  Post vital signs: Reviewed and stable  Last Vitals:  Vitals:   06/08/17 1148 06/08/17 1213  BP: (!) 180/91 (!) 192/92  Pulse: 79   Resp: 17 16  Temp: 36.6 C   SpO2: 94% 96%    Last Pain:  Vitals:   06/08/17 1148  TempSrc: Oral      Patients Stated Pain Goal: 5 (23/55/73 2202)  Complications: No apparent anesthesia complications

## 2017-06-08 NOTE — Anesthesia Procedure Notes (Signed)
Procedure Name: MAC Date/Time: 06/08/2017 12:18 PM Performed by: Andree Elk Enedelia Martorelli A, CRNA Pre-anesthesia Checklist: Patient identified, Timeout performed, Emergency Drugs available and Suction available Oxygen Delivery Method: Nasal cannula

## 2017-06-08 NOTE — Discharge Instructions (Signed)

## 2017-06-08 NOTE — Op Note (Signed)
Date of Admission: 06/08/2017  Date of Surgery: 06/08/2017  Pre-Op Dx: Cataract Left  Eye  Post-Op Dx: Senile Nuclear Cataract  Left  Eye,  Dx Code H25.12  Surgeon: Tonny Branch, M.D.  Assistants: None  Anesthesia: Topical with MAC  Indications: Painless, progressive loss of vision with compromise of daily activities.  Surgery: Cataract Extraction with Intraocular lens Implant Left Eye  Discription: The patient had dilating drops and viscous lidocaine placed into the Left eye in the pre-op holding area. After transfer to the operating room, a time out was performed. The patient was then prepped and draped. Beginning with a 49m blade a paracentesis port was made at the surgeon's 2 o'clock position. The anterior chamber was then filled with 1% non-preserved lidocaine. This was followed by filling the anterior chamber with Provisc.  A 2.464mkeratome blade was used to make a clear corneal incision at the temporal limbus.  A bent cystatome needle was used to create a continuous tear capsulotomy. Hydrodissection was performed with balanced salt solution on a Fine canula. The lens nucleus was then removed using the phacoemulsification handpiece. Residual cortex was removed with the I&A handpiece. The anterior chamber and capsular bag were refilled with Provisc. A posterior chamber intraocular lens was placed into the capsular bag with it's injector. The implant was positioned with the Kuglan hook. The Provisc was then removed from the anterior chamber and capsular bag with the I&A handpiece. Stromal hydration of the main incision and paracentesis port was performed with BSS on a Fine canula. The wounds were tested for leak which was negative. The patient tolerated the procedure well. There were no operative complications. The patient was then transferred to the recovery room in stable condition.  Complications: None  Specimen: None  EBL: None  Prosthetic device: [employer]J Technis, PCB00, power 12.0, SN  [id number]

## 2017-06-08 NOTE — Anesthesia Preprocedure Evaluation (Signed)
Anesthesia Evaluation  Patient identified by MRN, date of birth, ID band Patient awake    Reviewed: Allergy & Precautions, NPO status , Patient's Chart, lab work & pertinent test results, reviewed documented beta blocker date and time   Airway Mallampati: II  TM Distance: >3 FB Neck ROM: Full    Dental  (+) Edentulous Upper, Edentulous Lower   Pulmonary COPD, Current Smoker,    breath sounds clear to auscultation       Cardiovascular hypertension, Pt. on medications and Pt. on home beta blockers +CHF and + DOE   Rhythm:Regular Rate:Normal     Neuro/Psych PSYCHIATRIC DISORDERS Anxiety Depression    GI/Hepatic GERD  ,  Endo/Other  diabetes, Type 2  Renal/GU      Musculoskeletal  (+) Arthritis ,   Abdominal   Peds  Hematology  (+) anemia ,   Anesthesia Other Findings   Reproductive/Obstetrics                             Anesthesia Physical Anesthesia Plan  ASA: III  Anesthesia Plan: MAC   Post-op Pain Management:    Induction: Intravenous  PONV Risk Score and Plan:   Airway Management Planned: Nasal Cannula  Additional Equipment:   Intra-op Plan:   Post-operative Plan:   Informed Consent: I have reviewed the patients History and Physical, chart, labs and discussed the procedure including the risks, benefits and alternatives for the proposed anesthesia with the patient or authorized representative who has indicated his/her understanding and acceptance.     Plan Discussed with:   Anesthesia Plan Comments:         Anesthesia Quick Evaluation

## 2017-06-08 NOTE — Anesthesia Postprocedure Evaluation (Signed)
Anesthesia Post Note  Patient: Katherine Romero  Procedure(s) Performed: CATARACT EXTRACTION PHACO AND INTRAOCULAR LENS PLACEMENT ([place]) (Left Eye)  Patient location during evaluation: Short Stay Anesthesia Type: MAC Level of consciousness: awake and alert and oriented Pain management: pain level controlled Vital Signs Assessment: post-procedure vital signs reviewed and stable Respiratory status: spontaneous breathing and respiratory function stable Cardiovascular status: stable Postop Assessment: no apparent nausea or vomiting Anesthetic complications: no     Last Vitals:  Vitals:   06/08/17 1238 06/08/17 1244  BP: (!) 184/91 (!) 185/89  Pulse: 79   Resp: 18   Temp: 36.7 C   SpO2: 94%     Last Pain:  Vitals:   06/08/17 1238  TempSrc: Oral                 Kavontae Pritchard A

## 2017-06-08 NOTE — H&P (Signed)
I have reviewed the H&P, the patient was re-examined, and I have identified no interval changes in medical condition and plan of care since the history and physical of record  

## 2017-06-09 ENCOUNTER — Encounter (HOSPITAL_COMMUNITY): Payer: Self-pay | Admitting: Ophthalmology

## 2017-06-18 ENCOUNTER — Encounter (HOSPITAL_COMMUNITY): Payer: Self-pay

## 2017-06-18 ENCOUNTER — Encounter (HOSPITAL_COMMUNITY)
Admission: RE | Admit: 2017-06-18 | Discharge: 2017-06-18 | Disposition: A | Discharge status: Home / Self Care | Payer: Medicare Other | Source: Ambulatory Visit | Attending: Ophthalmology | Admitting: Ophthalmology

## 2017-06-22 ENCOUNTER — Ambulatory Visit (HOSPITAL_COMMUNITY): Payer: Medicare Other | Admitting: Anesthesiology

## 2017-06-22 ENCOUNTER — Encounter (HOSPITAL_COMMUNITY)
Admission: RE | Disposition: A | Discharge status: Home / Self Care | Payer: Self-pay | Source: Ambulatory Visit | Attending: Ophthalmology

## 2017-06-22 ENCOUNTER — Encounter (HOSPITAL_COMMUNITY): Payer: Self-pay | Admitting: Emergency Medicine

## 2017-06-22 ENCOUNTER — Ambulatory Visit (HOSPITAL_COMMUNITY)
Admission: RE | Admit: 2017-06-22 | Discharge: 2017-06-22 | Disposition: A | Discharge status: Home / Self Care | Payer: Medicare Other | Source: Ambulatory Visit | Attending: Ophthalmology | Admitting: Ophthalmology

## 2017-06-22 DIAGNOSIS — E118 Type 2 diabetes mellitus with unspecified complications: Secondary | ICD-10-CM | POA: Diagnosis not present

## 2017-06-22 DIAGNOSIS — K219 Gastro-esophageal reflux disease without esophagitis: Secondary | ICD-10-CM | POA: Insufficient documentation

## 2017-06-22 DIAGNOSIS — F17200 Nicotine dependence, unspecified, uncomplicated: Secondary | ICD-10-CM | POA: Diagnosis not present

## 2017-06-22 DIAGNOSIS — M1990 Unspecified osteoarthritis, unspecified site: Secondary | ICD-10-CM | POA: Diagnosis not present

## 2017-06-22 DIAGNOSIS — D649 Anemia, unspecified: Secondary | ICD-10-CM | POA: Diagnosis not present

## 2017-06-22 DIAGNOSIS — I110 Hypertensive heart disease with heart failure: Secondary | ICD-10-CM | POA: Diagnosis not present

## 2017-06-22 DIAGNOSIS — H2511 Age-related nuclear cataract, right eye: Secondary | ICD-10-CM | POA: Insufficient documentation

## 2017-06-22 DIAGNOSIS — J449 Chronic obstructive pulmonary disease, unspecified: Secondary | ICD-10-CM | POA: Insufficient documentation

## 2017-06-22 DIAGNOSIS — F418 Other specified anxiety disorders: Secondary | ICD-10-CM | POA: Diagnosis not present

## 2017-06-22 HISTORY — PX: CATARACT EXTRACTION W/PHACO: SHX586

## 2017-06-22 LAB — GLUCOSE, CAPILLARY: Glucose-Capillary: 149 mg/dL — ABNORMAL HIGH (ref 65–99)

## 2017-06-22 SURGERY — PHACOEMULSIFICATION, CATARACT, WITH IOL INSERTION
Anesthesia: Monitor Anesthesia Care | Site: Eye | Laterality: Right

## 2017-06-22 MED ORDER — LACTATED RINGERS IV SOLN
INTRAVENOUS | Status: DC
  Administered 2017-06-22: 09:00:00 via INTRAVENOUS

## 2017-06-22 MED ORDER — BSS IO SOLN
INTRAOCULAR | Status: DC | PRN
  Administered 2017-06-22: 15 mL

## 2017-06-22 MED ORDER — LIDOCAINE HCL (PF) 1 % IJ SOLN
INTRAMUSCULAR | Status: DC | PRN
  Administered 2017-06-22: .4 mL

## 2017-06-22 MED ORDER — MIDAZOLAM HCL 2 MG/2ML IJ SOLN
1.0000 mg | INTRAMUSCULAR | Status: AC
  Administered 2017-06-22: 1 mg via INTRAVENOUS

## 2017-06-22 MED ORDER — MIDAZOLAM HCL 2 MG/2ML IJ SOLN
INTRAMUSCULAR | Status: AC
  Filled 2017-06-22: qty 2

## 2017-06-22 MED ORDER — PROVISC 10 MG/ML IO SOLN
INTRAOCULAR | Status: DC | PRN
  Administered 2017-06-22: 0.85 mL via INTRAOCULAR

## 2017-06-22 MED ORDER — FENTANYL CITRATE (PF) 100 MCG/2ML IJ SOLN
INTRAMUSCULAR | Status: AC
  Filled 2017-06-22: qty 2

## 2017-06-22 MED ORDER — LABETALOL HCL 5 MG/ML IV SOLN
INTRAVENOUS | Status: AC
  Filled 2017-06-22: qty 4

## 2017-06-22 MED ORDER — LABETALOL HCL 5 MG/ML IV SOLN
5.0000 mg | Freq: Once | INTRAVENOUS | Status: AC
  Administered 2017-06-22: 5 mg via INTRAVENOUS

## 2017-06-22 MED ORDER — PHENYLEPHRINE HCL 2.5 % OP SOLN
1.0000 [drp] | OPHTHALMIC | Status: AC
  Administered 2017-06-22 (×3): 1 [drp] via OPHTHALMIC

## 2017-06-22 MED ORDER — TETRACAINE HCL 0.5 % OP SOLN
1.0000 [drp] | OPHTHALMIC | Status: AC
  Administered 2017-06-22 (×3): 1 [drp] via OPHTHALMIC

## 2017-06-22 MED ORDER — FENTANYL CITRATE (PF) 100 MCG/2ML IJ SOLN
25.0000 ug | Freq: Once | INTRAMUSCULAR | Status: AC
  Administered 2017-06-22: 25 ug via INTRAVENOUS

## 2017-06-22 MED ORDER — POVIDONE-IODINE 5 % OP SOLN
OPHTHALMIC | Status: DC | PRN
  Administered 2017-06-22: 1 via OPHTHALMIC

## 2017-06-22 MED ORDER — NEOMYCIN-POLYMYXIN-DEXAMETH 3.5-10000-0.1 OP SUSP
OPHTHALMIC | Status: DC | PRN
  Administered 2017-06-22: 2 [drp] via OPHTHALMIC

## 2017-06-22 MED ORDER — LABETALOL HCL 5 MG/ML IV SOLN
INTRAVENOUS | Status: DC | PRN
  Administered 2017-06-22: 5 mg via INTRAVENOUS

## 2017-06-22 MED ORDER — EPINEPHRINE PF 1 MG/ML IJ SOLN
INTRAOCULAR | Status: DC | PRN
  Administered 2017-06-22: 500 mL

## 2017-06-22 MED ORDER — LIDOCAINE HCL 3.5 % OP GEL
1.0000 "application " | Freq: Once | OPHTHALMIC | Status: AC
  Administered 2017-06-22: 1 via OPHTHALMIC

## 2017-06-22 MED ORDER — CYCLOPENTOLATE-PHENYLEPHRINE 0.2-1 % OP SOLN
1.0000 [drp] | OPHTHALMIC | Status: AC
  Administered 2017-06-22 (×3): 1 [drp] via OPHTHALMIC

## 2017-06-22 SURGICAL SUPPLY — 10 items

## 2017-06-22 NOTE — Anesthesia Procedure Notes (Signed)
Procedure Name: MAC Date/Time: 06/22/2017 9:56 AM Performed by: Andree Elk Amy A, CRNA Pre-anesthesia Checklist: Patient identified, Emergency Drugs available, Suction available, Patient being monitored and Timeout performed Oxygen Delivery Method: Nasal cannula

## 2017-06-22 NOTE — Anesthesia Postprocedure Evaluation (Signed)
Anesthesia Post Note  Patient: Katherine Romero  Procedure(s) Performed: CATARACT EXTRACTION WITH PHACOEMULSIFICATION  AND INTRAOCULAR LENS PLACEMENT RIGHT EYE (Right Eye)  Patient location during evaluation: Short Stay Anesthesia Type: MAC Level of consciousness: awake and alert, oriented and patient cooperative Pain management: pain level controlled Vital Signs Assessment: post-procedure vital signs reviewed and stable Respiratory status: spontaneous breathing Cardiovascular status: stable Postop Assessment: no apparent nausea or vomiting Anesthetic complications: no     Last Vitals:  Vitals:   06/22/17 0950 06/22/17 0954  BP: (!) 165/88   Pulse:    Resp: (!) 22 16  Temp:    SpO2: 95% 95%    Last Pain:  Vitals:   06/22/17 0836  TempSrc: Oral  PainSc: 1                  ADAMS, AMY A

## 2017-06-22 NOTE — Transfer of Care (Signed)
Immediate Anesthesia Transfer of Care Note  Patient: Katherine Romero  Procedure(s) Performed: CATARACT EXTRACTION WITH PHACOEMULSIFICATION  AND INTRAOCULAR LENS PLACEMENT RIGHT EYE (Right Eye)  Patient Location: Short Stay  Anesthesia Type:MAC  Level of Consciousness: awake, alert , oriented and patient cooperative  Airway & Oxygen Therapy: Patient Spontanous Breathing  Post-op Assessment: Report given to RN and Post -op Vital signs reviewed and stable  Post vital signs: Reviewed and stable  Last Vitals:  Vitals Value Taken Time  BP    Temp    Pulse    Resp    SpO2      Last Pain:  Vitals:   06/22/17 0836  TempSrc: Oral  PainSc: 1          Complications: No apparent anesthesia complications

## 2017-06-22 NOTE — Op Note (Signed)
Date of Admission: 06/22/2017  Date of Surgery: 06/22/2017  Pre-Op Dx: Cataract Right  Eye  Post-Op Dx: Senile Nuclear Cataract  Right  Eye,  Dx Code H25.11  Surgeon: Tonny Branch, M.D.  Assistants: None  Anesthesia: Topical with MAC  Indications: Painless, progressive loss of vision with compromise of daily activities.  Surgery: Cataract Extraction with Intraocular lens Implant Right Eye  Discription: The patient had dilating drops and viscous lidocaine placed into the Right eye in the pre-op holding area. After transfer to the operating room, a time out was performed. The patient was then prepped and draped. Beginning with a 52m blade a paracentesis port was made at the surgeon's 2 o'clock position. The anterior chamber was then filled with 1% non-preserved lidocaine. This was followed by filling the anterior chamber with Provisc.  A 2.479mkeratome blade was used to make a clear corneal incision at the temporal limbus.  A bent cystatome needle was used to create a continuous tear capsulotomy. Hydrodissection was performed with balanced salt solution on a Fine canula. The lens nucleus was then removed using the phacoemulsification handpiece. Residual cortex was removed with the I&A handpiece. The anterior chamber and capsular bag were refilled with Provisc. A posterior chamber intraocular lens was placed into the capsular bag with it's injector. The implant was positioned with the Kuglan hook. The Provisc was then removed from the anterior chamber and capsular bag with the I&A handpiece. Stromal hydration of the main incision and paracentesis port was performed with BSS on a Fine canula. The wounds were tested for leak which was negative. The patient tolerated the procedure well. There were no operative complications. The patient was then transferred to the recovery room in stable condition.  Complications: None  Specimen: None  EBL: None  Prosthetic device: [employer] Technis, PCB00, power 14.0,  SN [id number]

## 2017-06-22 NOTE — H&P (Signed)
I have reviewed the H&P, the patient was re-examined, and I have identified no interval changes in medical condition and plan of care since the history and physical of record  

## 2017-06-22 NOTE — Anesthesia Preprocedure Evaluation (Signed)
Anesthesia Evaluation  Patient identified by MRN, date of birth, ID band Patient awake    Reviewed: Allergy & Precautions, NPO status , Patient's Chart, lab work & pertinent test results, reviewed documented beta blocker date and time   Airway Mallampati: II  TM Distance: >3 FB Neck ROM: Full    Dental  (+) Edentulous Upper, Edentulous Lower   Pulmonary COPD, Current Smoker,    breath sounds clear to auscultation       Cardiovascular hypertension, Pt. on medications and Pt. on home beta blockers +CHF and + DOE   Rhythm:Regular Rate:Normal     Neuro/Psych PSYCHIATRIC DISORDERS Anxiety Depression    GI/Hepatic GERD  ,  Endo/Other  diabetes, Type 2  Renal/GU      Musculoskeletal  (+) Arthritis ,   Abdominal   Peds  Hematology  (+) anemia ,   Anesthesia Other Findings   Reproductive/Obstetrics                             Anesthesia Physical Anesthesia Plan  ASA: III  Anesthesia Plan: MAC   Post-op Pain Management:    Induction: Intravenous  PONV Risk Score and Plan:   Airway Management Planned: Nasal Cannula  Additional Equipment:   Intra-op Plan:   Post-operative Plan:   Informed Consent: I have reviewed the patients History and Physical, chart, labs and discussed the procedure including the risks, benefits and alternatives for the proposed anesthesia with the patient or authorized representative who has indicated his/her understanding and acceptance.     Plan Discussed with:   Anesthesia Plan Comments:         Anesthesia Quick Evaluation

## 2017-06-22 NOTE — Discharge Instructions (Signed)

## 2017-06-23 ENCOUNTER — Encounter (HOSPITAL_COMMUNITY): Payer: Self-pay | Admitting: Ophthalmology

## 2017-09-09 ENCOUNTER — Telehealth (HOSPITAL_COMMUNITY): Payer: Self-pay | Admitting: Internal Medicine

## 2017-09-09 NOTE — Telephone Encounter (Signed)
09/09/17 10:22am Called and left msg for patient to call due to a referral from [facility].Marland Kitchen.Marguerite Olea/sh
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
## 2017-10-14 ENCOUNTER — Encounter (HOSPITAL_COMMUNITY): Payer: Self-pay | Admitting: Psychiatry

## 2017-10-14 ENCOUNTER — Ambulatory Visit (INDEPENDENT_AMBULATORY_CARE_PROVIDER_SITE_OTHER): Payer: Medicare Other | Admitting: Psychiatry

## 2017-10-14 ENCOUNTER — Encounter (INDEPENDENT_AMBULATORY_CARE_PROVIDER_SITE_OTHER): Payer: Self-pay

## 2017-10-14 VITALS — BP 157/81 | HR 85 | Temp 97.6°F | Resp 18 | Ht 60.87 in | Wt 159.8 lb

## 2017-10-14 DIAGNOSIS — F329 Major depressive disorder, single episode, unspecified: Secondary | ICD-10-CM | POA: Insufficient documentation

## 2017-10-14 DIAGNOSIS — F331 Major depressive disorder, recurrent, moderate: Secondary | ICD-10-CM | POA: Diagnosis not present

## 2017-10-14 HISTORY — DX: Major depressive disorder, single episode, unspecified: F32.9

## 2017-10-14 MED ORDER — ARIPIPRAZOLE 10 MG PO TABS: 10.0000 mg | ORAL_TABLET | Freq: Every day | ORAL | 0 refills | Status: DC

## 2017-10-14 MED ORDER — VENLAFAXINE HCL ER 75 MG PO CP24: 225.0000 mg | ORAL_CAPSULE | Freq: Every day | ORAL | 0 refills | Status: DC

## 2017-10-14 NOTE — Patient Instructions (Signed)
1. Continue Effexor 225 mg daily  2. Reinitiate Abilify 10 mg once a day,  3. Return to clinic in one month for 30 mins 4. CONTACT INFORMATION  What to do if you need to get in touch with someone regarding a psychiatric issue:  1. EMERGENCY: For psychiatric emergencies (if you are suicidal or if there are any other safety issues) call 911 and/or go to your nearest Emergency Room immediately.   2. IF YOU NEED SOMEONE TO TALK TO RIGHT NOW: Given my clinical responsibilities, I may not be able to speak with you over the phone for a prolonged period of time.  a. You may always call The National Suicide Prevention Lifeline at 1-800-273-TALK [phone](8255).  b. Your county of residence will also have local crisis services. [place] [facility]: [facility] at ([phone] (24 Hour Crisis Hotline)

## 2017-11-04 ENCOUNTER — Other Ambulatory Visit: Payer: Self-pay | Admitting: Internal Medicine

## 2017-11-04 DIAGNOSIS — R921 Mammographic calcification found on diagnostic imaging of breast: Secondary | ICD-10-CM

## 2017-11-16 ENCOUNTER — Ambulatory Visit
Admission: RE | Admit: 2017-11-16 | Discharge: 2017-11-16 | Disposition: A | Discharge status: Home / Self Care | Payer: Medicaid Other | Source: Ambulatory Visit | Attending: Internal Medicine | Admitting: Internal Medicine

## 2017-11-16 DIAGNOSIS — R921 Mammographic calcification found on diagnostic imaging of breast: Secondary | ICD-10-CM
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
## 2017-11-23 ENCOUNTER — Encounter (HOSPITAL_COMMUNITY): Payer: Self-pay | Admitting: Psychiatry

## 2017-11-23 ENCOUNTER — Ambulatory Visit (INDEPENDENT_AMBULATORY_CARE_PROVIDER_SITE_OTHER): Payer: Medicare Other | Admitting: Psychiatry

## 2017-11-23 VITALS — BP 174/94 | HR 105 | Ht 60.0 in | Wt 161.0 lb

## 2017-11-23 DIAGNOSIS — G4700 Insomnia, unspecified: Secondary | ICD-10-CM

## 2017-11-23 DIAGNOSIS — F331 Major depressive disorder, recurrent, moderate: Secondary | ICD-10-CM

## 2017-11-23 DIAGNOSIS — R450 Nervousness: Secondary | ICD-10-CM

## 2017-11-23 DIAGNOSIS — F17210 Nicotine dependence, cigarettes, uncomplicated: Secondary | ICD-10-CM

## 2017-11-23 DIAGNOSIS — R45850 Homicidal ideations: Secondary | ICD-10-CM | POA: Diagnosis not present

## 2017-11-23 MED ORDER — ARIPIPRAZOLE 15 MG PO TABS: 15.0000 mg | ORAL_TABLET | Freq: Every day | ORAL | 0 refills | Status: DC

## 2017-11-23 MED ORDER — VENLAFAXINE HCL ER 75 MG PO CP24: 225.0000 mg | ORAL_CAPSULE | Freq: Every day | ORAL | 0 refills | Status: DC

## 2017-11-23 NOTE — Patient Instructions (Signed)
1. Continue Effexor 225 mg daily  2. Increase Abilify 15 mg once a day,  3. Return to clinic in one month for 30 mins 4. CONTACT INFORMATION  What to do if you need to get in touch with someone regarding a psychiatric issue:  1. EMERGENCY: For psychiatric emergencies (if you are suicidal or if there are any other safety issues) call 911 and/or go to your nearest Emergency Room immediately.   2. IF YOU NEED SOMEONE TO TALK TO RIGHT NOW: Given my clinical responsibilities, I may not be able to speak with you over the phone for a prolonged period of time.  a. You may always call The National Suicide Prevention Lifeline at 1-800-273-TALK [phone](8255).  b. Your county of residence will also have local crisis services. [place] [facility] [place]: [facility] at [phone] (24 Hour Crisis Hotline)

## 2017-12-24 ENCOUNTER — Ambulatory Visit (HOSPITAL_COMMUNITY): Payer: Self-pay | Admitting: Psychiatry
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
## 2018-01-01 ENCOUNTER — Ambulatory Visit (INDEPENDENT_AMBULATORY_CARE_PROVIDER_SITE_OTHER): Payer: Medicare Other | Admitting: Psychiatry

## 2018-01-01 VITALS — BP 185/84 | HR 94 | Ht 60.0 in | Wt 163.0 lb

## 2018-01-01 DIAGNOSIS — G4700 Insomnia, unspecified: Secondary | ICD-10-CM

## 2018-01-01 DIAGNOSIS — F419 Anxiety disorder, unspecified: Secondary | ICD-10-CM | POA: Diagnosis not present

## 2018-01-01 DIAGNOSIS — F17210 Nicotine dependence, cigarettes, uncomplicated: Secondary | ICD-10-CM | POA: Diagnosis not present

## 2018-01-01 DIAGNOSIS — F331 Major depressive disorder, recurrent, moderate: Secondary | ICD-10-CM | POA: Diagnosis not present

## 2018-01-01 DIAGNOSIS — R45851 Suicidal ideations: Secondary | ICD-10-CM

## 2018-01-01 MED ORDER — ARIPIPRAZOLE 15 MG PO TABS: 15.0000 mg | ORAL_TABLET | Freq: Every day | ORAL | 0 refills | Status: DC

## 2018-01-01 MED ORDER — VENLAFAXINE HCL ER 75 MG PO CP24: 225.0000 mg | ORAL_CAPSULE | Freq: Every day | ORAL | 0 refills | Status: DC

## 2018-01-01 NOTE — Patient Instructions (Signed)
1. Continue Effexor 225 mg daily  2. Continue Abilify 15 mg once a day 3.Return to clinic in three months for 30 mins

## 2018-04-06 ENCOUNTER — Telehealth (HOSPITAL_COMMUNITY): Payer: Self-pay

## 2018-04-06 ENCOUNTER — Ambulatory Visit (HOSPITAL_COMMUNITY): Payer: Medicaid Other | Admitting: Psychiatry

## 2018-04-06 ENCOUNTER — Other Ambulatory Visit (HOSPITAL_COMMUNITY): Payer: Self-pay | Admitting: Psychiatry

## 2018-04-06 MED ORDER — VENLAFAXINE HCL ER 75 MG PO CP24: 225.0000 mg | ORAL_CAPSULE | Freq: Every day | ORAL | 0 refills | Status: DC

## 2018-04-06 MED ORDER — ARIPIPRAZOLE 15 MG PO TABS: 15.0000 mg | ORAL_TABLET | Freq: Every day | ORAL | 0 refills | Status: DC

## 2018-04-06 NOTE — Telephone Encounter (Signed)
Patient called requesting a refill on Effexor-XR 75mg , and Ariipiprazole 15mg . Next appointment is 04/29/18. Please send to [facility]

## 2018-04-06 NOTE — Telephone Encounter (Signed)
Called patient told her that her medications had been sent to the pharmacy

## 2018-04-06 NOTE — Telephone Encounter (Signed)
sent 
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
## 2018-04-29 ENCOUNTER — Encounter (HOSPITAL_COMMUNITY): Payer: Self-pay | Admitting: Psychiatry

## 2018-04-29 ENCOUNTER — Ambulatory Visit (INDEPENDENT_AMBULATORY_CARE_PROVIDER_SITE_OTHER): Payer: Medicare Other | Admitting: Psychiatry

## 2018-04-29 VITALS — BP 180/90 | HR 102 | Ht 60.0 in | Wt 158.0 lb

## 2018-04-29 DIAGNOSIS — F331 Major depressive disorder, recurrent, moderate: Secondary | ICD-10-CM | POA: Diagnosis not present

## 2018-04-29 MED ORDER — VENLAFAXINE HCL ER 75 MG PO CP24: 225.0000 mg | ORAL_CAPSULE | Freq: Every day | ORAL | 0 refills | Status: DC

## 2018-04-29 MED ORDER — ARIPIPRAZOLE 15 MG PO TABS: 15.0000 mg | ORAL_TABLET | Freq: Every day | ORAL | 0 refills | Status: DC

## 2018-04-29 NOTE — Patient Instructions (Addendum)
1. Continue Effexor 225 mg daily  2.Continue Abilify 15mg  once a day, (EKG QTc 393 msec 09/2017) 3.Return to clinic in three months for 15 mins

## 2018-07-20 ENCOUNTER — Telehealth (HOSPITAL_COMMUNITY): Payer: Self-pay | Admitting: *Deleted

## 2018-07-20 NOTE — Telephone Encounter (Signed)
Dr Vanetta Shawl NP,  Ladona Ridgel @ [facility] ( Dr Alver Fisher Office) Called to inform that Patient came in today because they are monitoring her BP. Patient did mention to the NP that she is hearing voices & that @ night they are not as bad. Patient has been placed on Clonidine 0.5 mg Bid for BP & Anxiety. Nurse Practioner stated that patient's BP has come down since starting the Clonidine & that she looked better than she has seen her looking in awhile. Patient also mentioned concerns that her husband always sit's in on her visit when she comes to her appointment here. And she doesn't feel like she can really open up. She realizes he's concerned & protective since her Stroke but she's unable to share because he's always around. PCP office has now only allowed her back. Patient's next appointment is 07-29-2018 & I explained to the NP we aren't having patients come into the office. That all visit's or Tele Med  Visual or Telephone. And that patient's concern's would be passed along.

## 2018-07-20 NOTE — Telephone Encounter (Signed)
Noted , will discuss at her next visit.
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
## 2018-07-29 ENCOUNTER — Ambulatory Visit (INDEPENDENT_AMBULATORY_CARE_PROVIDER_SITE_OTHER): Payer: Medicare Other | Admitting: Psychiatry

## 2018-07-29 ENCOUNTER — Other Ambulatory Visit: Payer: Self-pay

## 2018-07-29 ENCOUNTER — Encounter (HOSPITAL_COMMUNITY): Payer: Self-pay | Admitting: Psychiatry

## 2018-07-29 DIAGNOSIS — F331 Major depressive disorder, recurrent, moderate: Secondary | ICD-10-CM | POA: Diagnosis not present

## 2018-07-29 MED ORDER — VENLAFAXINE HCL ER 75 MG PO CP24: 225.0000 mg | ORAL_CAPSULE | Freq: Every day | ORAL | 0 refills | Status: DC

## 2018-07-29 MED ORDER — ARIPIPRAZOLE 20 MG PO TABS: 20.0000 mg | ORAL_TABLET | Freq: Every day | ORAL | 0 refills | Status: DC

## 2018-07-29 NOTE — Patient Instructions (Signed)
1. Continue Effexor 225 mg daily  2.IncreaseAbilify 20mg  once a day, (EKG QTc 393 msec 09/2017) 3.Next appointment: 6/26 at 9:20 for 20 mins,
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
## 2018-09-24 ENCOUNTER — Encounter (HOSPITAL_COMMUNITY): Payer: Self-pay | Admitting: Psychiatry

## 2018-09-24 ENCOUNTER — Other Ambulatory Visit: Payer: Self-pay

## 2018-09-24 ENCOUNTER — Ambulatory Visit (INDEPENDENT_AMBULATORY_CARE_PROVIDER_SITE_OTHER): Payer: Medicare Other | Admitting: Psychiatry

## 2018-09-24 DIAGNOSIS — F3341 Major depressive disorder, recurrent, in partial remission: Secondary | ICD-10-CM

## 2018-09-24 MED ORDER — VENLAFAXINE HCL ER 75 MG PO CP24: 225.0000 mg | ORAL_CAPSULE | Freq: Every day | ORAL | 0 refills | Status: DC

## 2018-09-24 MED ORDER — ARIPIPRAZOLE 20 MG PO TABS: 20.0000 mg | ORAL_TABLET | Freq: Every day | ORAL | 0 refills | Status: DC

## 2018-09-24 NOTE — Patient Instructions (Signed)
1. Continue Effexor 225 mg daily  2.ContinueAbilify20mg  once a day 3.Next appointment: 9/21 at 10:20

## 2018-10-17 IMAGING — MG STEREOTACTIC CORE NEEDLE BIOPSY
8 of 15 series · 8 of 23 positions shown · non-contrast
Comparison: Previous exams.

ADDENDUM:
Pathology revealed - BENIGN BREAST TISSUE WITH FIBROCYSTIC CHANGE,
INCLUDING FIBROADENOMATOID CHANGE AND DYSTROPHIC CALCIFICATIONS of
the Left breast, upper inner. This was found to be concordant by Dr.
Lkw Tiger.

Pathology results were discussed with the patient by telephone. The
patient reported doing well after the biopsy with tenderness at the
site. Post biopsy instructions and care were reviewed and questions
were answered. The patient was encouraged to call The [REDACTED]
The patient was instructed to return for annual screening
mammography at [HOSPITAL] [place] in [HOSPITAL[facility]HOSPITAL].
Pathology results reported by Matsuyama Ryckebusch, RN on 11/18/2017.
CLINICAL DATA: Biopsy left breast calcifications
EXAM:
LEFT BREAST STEREOTACTIC CORE NEEDLE BIOPSY

[L (1 of 8)]
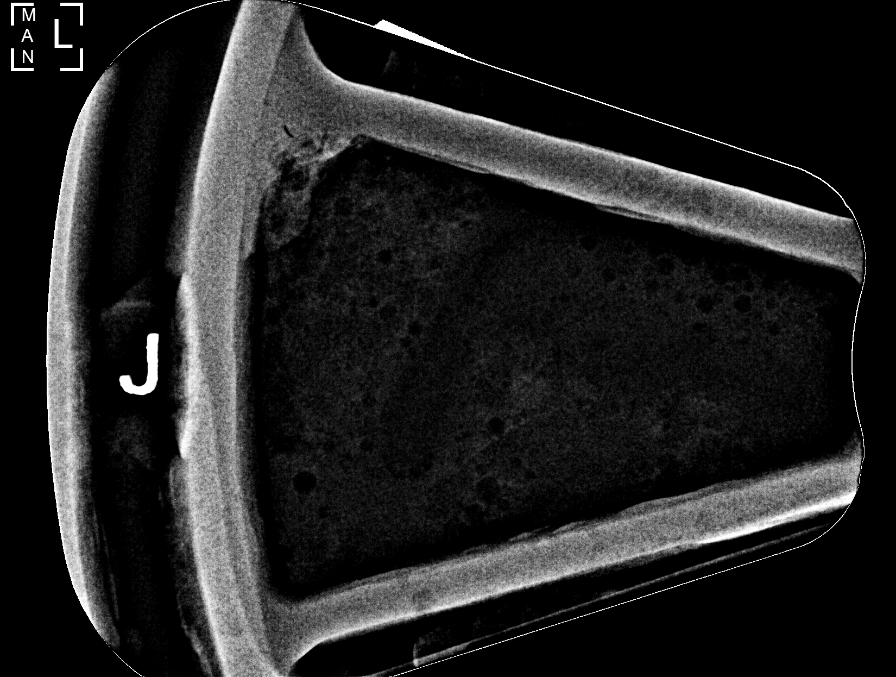

[L (2 of 8)]
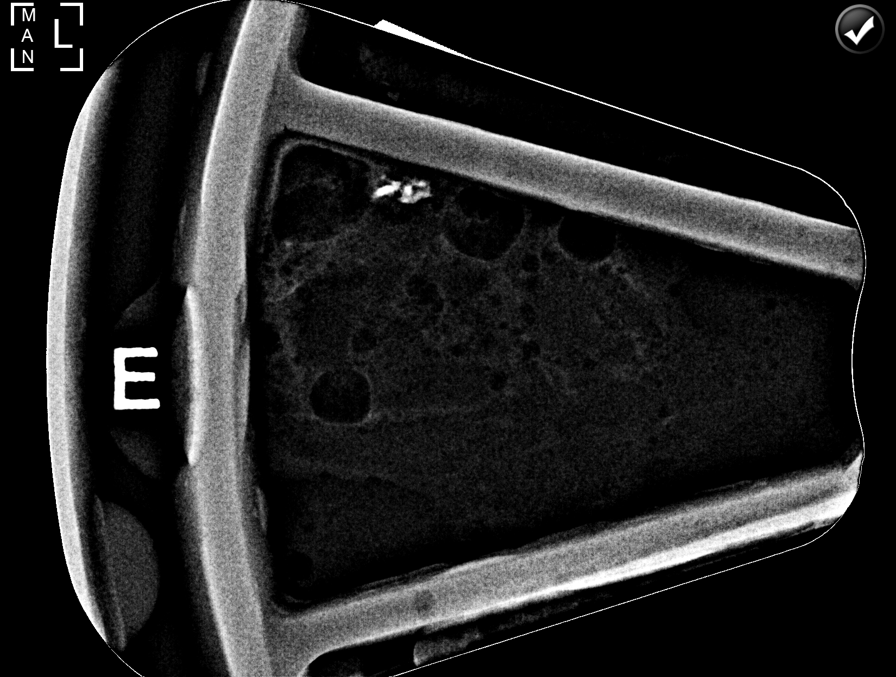

[L (3 of 8)]
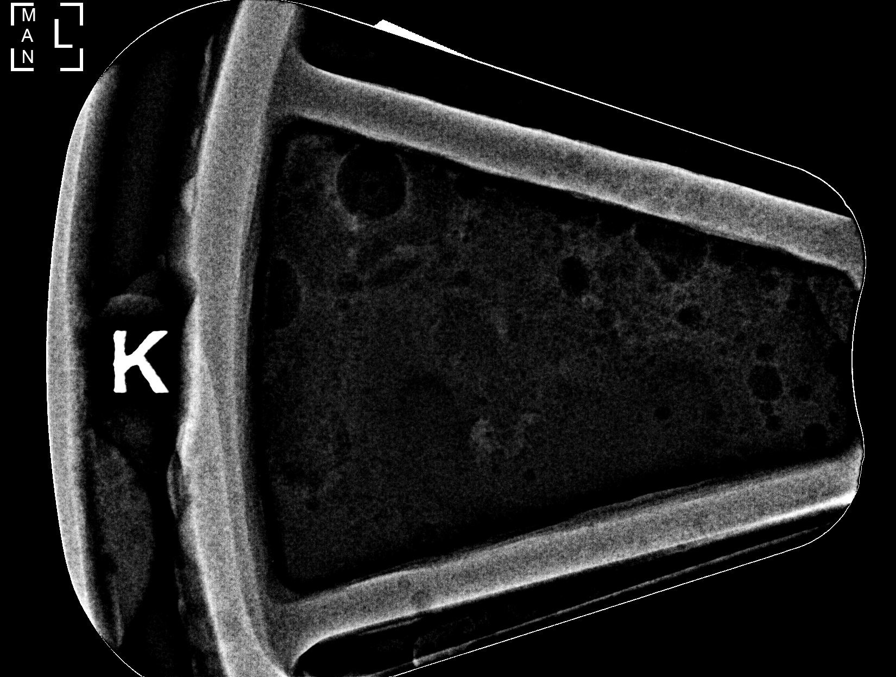

[L (4 of 8)]
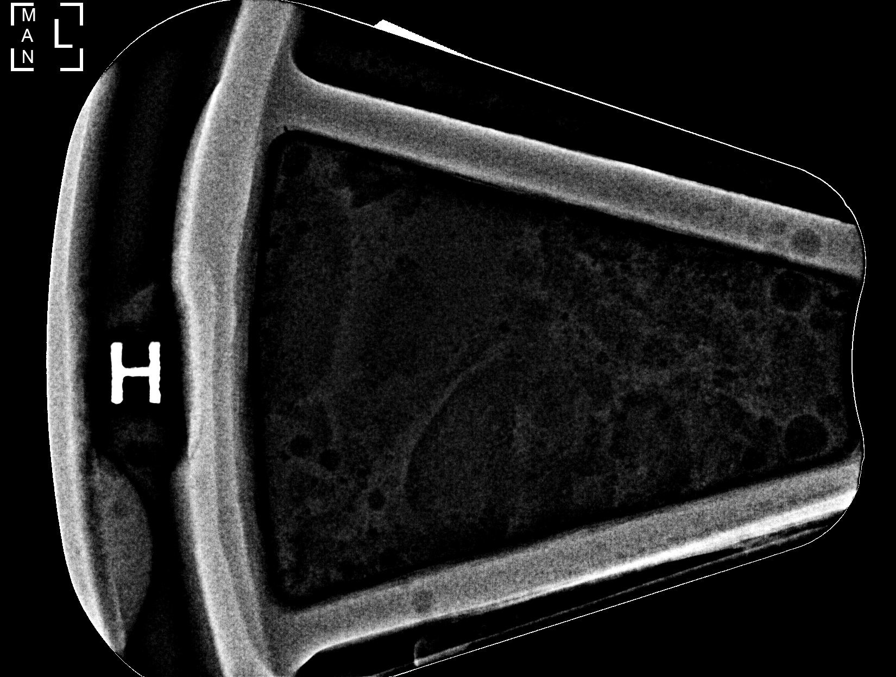

[L (5 of 8)]
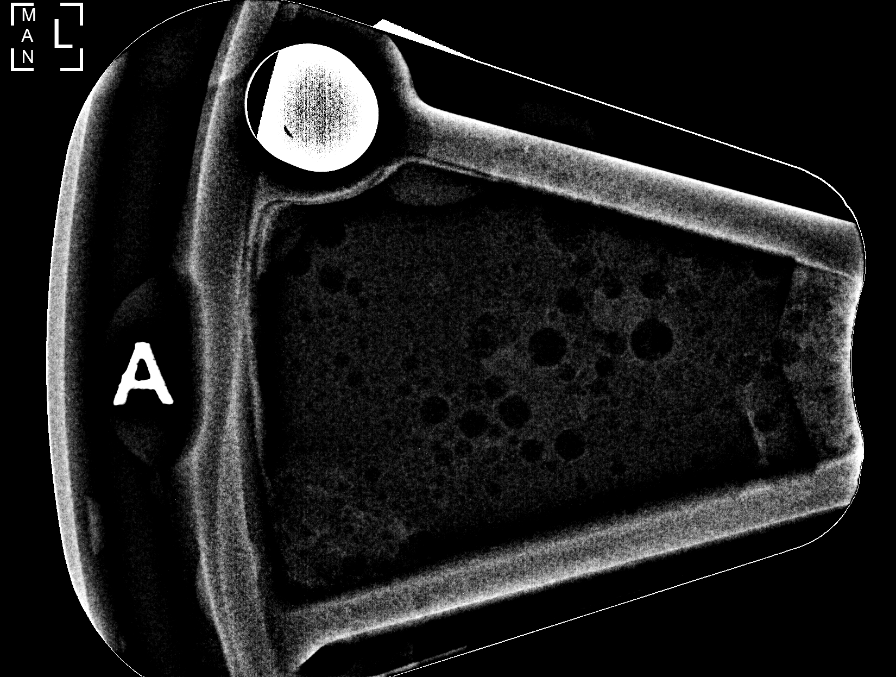

[L (6 of 8)]
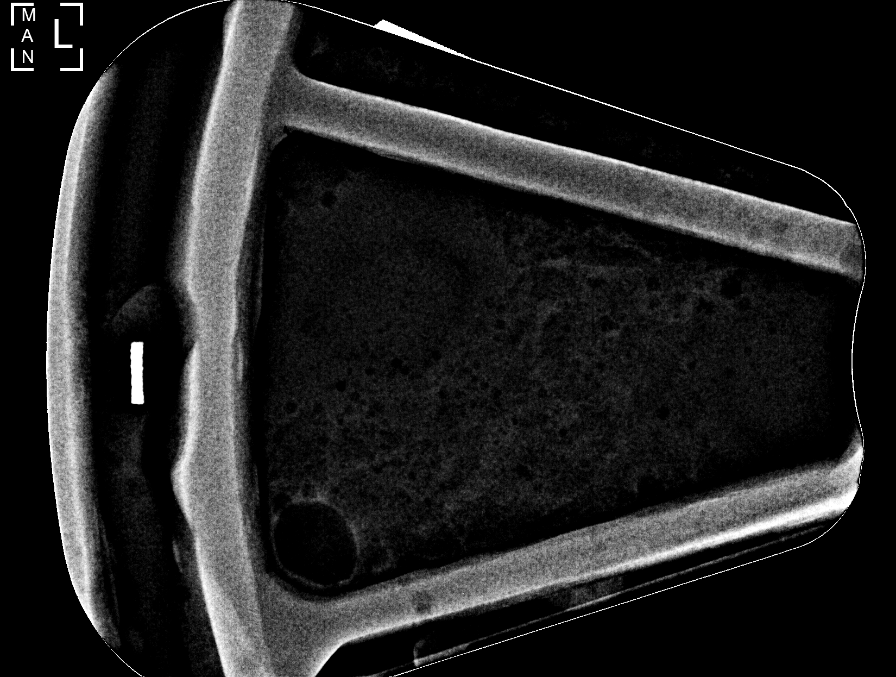

[L (7 of 8)]
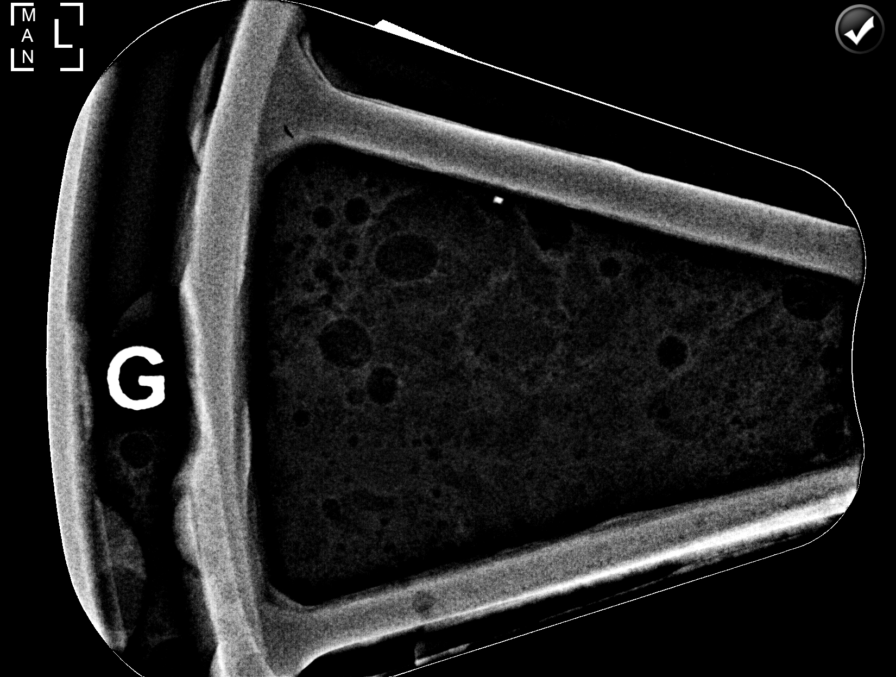

[L (8 of 8)]
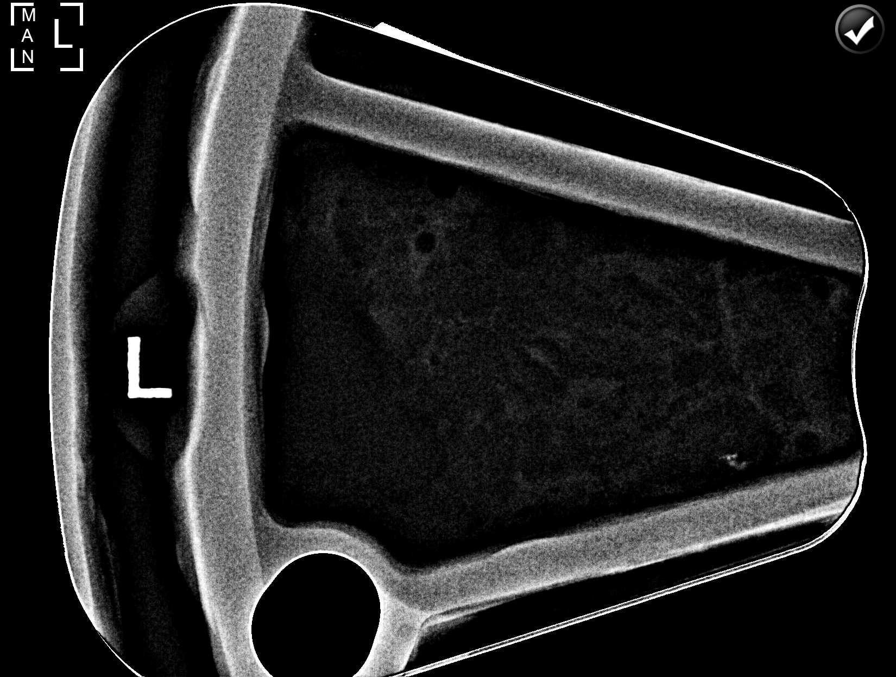

[8 of 23 positions shown; findings below may reference images not displayed]



Using sterile technique and 1% Lidocaine as local anesthetic, under
stereotactic guidance, a 9 gauge vacuum assisted device was used to
perform core needle biopsy of calcifications in the upper inner left
breast using a superior approach. Specimen radiograph was performed
showing calcifications in several specimens. Specimens with
calcifications are identified for pathology.

Lesion quadrant: Upper inner

At the conclusion of the procedure, a coil shaped tissue marker clip
was deployed into the biopsy cavity. Follow-up 2-view mammogram was
performed and dictated separately.
IMPRESSION: Stereotactic-guided biopsy of calcifications in the left breast. No
apparent complications.

## 2018-11-04 DIAGNOSIS — I10 Essential (primary) hypertension: Secondary | ICD-10-CM

## 2018-11-04 DIAGNOSIS — R0602 Shortness of breath: Secondary | ICD-10-CM

## 2018-11-04 HISTORY — DX: Shortness of breath: R06.02

## 2018-11-04 HISTORY — DX: Essential (primary) hypertension: I10
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
## 2018-12-20 ENCOUNTER — Other Ambulatory Visit: Payer: Self-pay

## 2018-12-20 ENCOUNTER — Encounter (HOSPITAL_COMMUNITY): Payer: Self-pay | Admitting: Psychiatry

## 2018-12-20 ENCOUNTER — Ambulatory Visit (INDEPENDENT_AMBULATORY_CARE_PROVIDER_SITE_OTHER): Payer: Medicare Other | Admitting: Psychiatry

## 2018-12-20 DIAGNOSIS — F3341 Major depressive disorder, recurrent, in partial remission: Secondary | ICD-10-CM | POA: Diagnosis not present

## 2018-12-20 MED ORDER — ARIPIPRAZOLE 20 MG PO TABS: 20.0000 mg | ORAL_TABLET | Freq: Every day | ORAL | 1 refills | Status: DC

## 2018-12-20 MED ORDER — VENLAFAXINE HCL ER 75 MG PO CP24: 225.0000 mg | ORAL_CAPSULE | Freq: Every day | ORAL | 1 refills | Status: DC

## 2018-12-20 NOTE — Patient Instructions (Addendum)
1. Continue Effexor 225 mg daily  2.ContinueAbilify20mg  once a day 3.Next appointment: 4 months

## 2019-03-21 ENCOUNTER — Other Ambulatory Visit (HOSPITAL_COMMUNITY): Payer: Self-pay | Admitting: Psychiatry

## 2019-03-21 MED ORDER — VENLAFAXINE HCL ER 75 MG PO CP24: 225.0000 mg | ORAL_CAPSULE | Freq: Every day | ORAL | 1 refills | Status: DC

## 2019-03-21 MED ORDER — ARIPIPRAZOLE 20 MG PO TABS: 20.0000 mg | ORAL_TABLET | Freq: Every day | ORAL | 1 refills | Status: DC

## 2019-04-06 DIAGNOSIS — F17210 Nicotine dependence, cigarettes, uncomplicated: Secondary | ICD-10-CM | POA: Diagnosis not present

## 2019-04-06 DIAGNOSIS — Z299 Encounter for prophylactic measures, unspecified: Secondary | ICD-10-CM | POA: Diagnosis not present

## 2019-04-06 DIAGNOSIS — J449 Chronic obstructive pulmonary disease, unspecified: Secondary | ICD-10-CM | POA: Diagnosis not present

## 2019-04-06 DIAGNOSIS — I10 Essential (primary) hypertension: Secondary | ICD-10-CM | POA: Diagnosis not present

## 2019-04-06 DIAGNOSIS — J441 Chronic obstructive pulmonary disease with (acute) exacerbation: Secondary | ICD-10-CM | POA: Diagnosis not present

## 2019-04-11 ENCOUNTER — Ambulatory Visit (HOSPITAL_COMMUNITY): Payer: Medicare Other | Admitting: Psychiatry

## 2019-04-15 DIAGNOSIS — I10 Essential (primary) hypertension: Secondary | ICD-10-CM | POA: Diagnosis not present

## 2019-04-15 DIAGNOSIS — J449 Chronic obstructive pulmonary disease, unspecified: Secondary | ICD-10-CM | POA: Diagnosis not present

## 2019-04-15 DIAGNOSIS — E7800 Pure hypercholesterolemia, unspecified: Secondary | ICD-10-CM | POA: Diagnosis not present

## 2019-04-15 DIAGNOSIS — Z299 Encounter for prophylactic measures, unspecified: Secondary | ICD-10-CM | POA: Diagnosis not present

## 2019-04-15 DIAGNOSIS — E1165 Type 2 diabetes mellitus with hyperglycemia: Secondary | ICD-10-CM | POA: Diagnosis not present

## 2019-04-15 DIAGNOSIS — F17210 Nicotine dependence, cigarettes, uncomplicated: Secondary | ICD-10-CM | POA: Diagnosis not present

## 2019-04-22 ENCOUNTER — Ambulatory Visit: Payer: Medicare Other | Admitting: Psychiatry

## 2019-04-26 DIAGNOSIS — E119 Type 2 diabetes mellitus without complications: Secondary | ICD-10-CM | POA: Diagnosis not present

## 2019-04-28 DIAGNOSIS — E782 Mixed hyperlipidemia: Secondary | ICD-10-CM | POA: Diagnosis not present

## 2019-04-28 DIAGNOSIS — I10 Essential (primary) hypertension: Secondary | ICD-10-CM | POA: Diagnosis not present

## 2019-04-28 DIAGNOSIS — I350 Nonrheumatic aortic (valve) stenosis: Secondary | ICD-10-CM

## 2019-04-28 DIAGNOSIS — I209 Angina pectoris, unspecified: Secondary | ICD-10-CM | POA: Diagnosis not present

## 2019-04-28 HISTORY — DX: Nonrheumatic aortic (valve) stenosis: I35.0

## 2019-05-16 ENCOUNTER — Encounter (HOSPITAL_COMMUNITY): Payer: Self-pay | Admitting: Psychiatry

## 2019-05-16 ENCOUNTER — Other Ambulatory Visit: Payer: Self-pay

## 2019-05-16 ENCOUNTER — Ambulatory Visit (INDEPENDENT_AMBULATORY_CARE_PROVIDER_SITE_OTHER): Payer: Medicare Other | Admitting: Psychiatry

## 2019-05-16 DIAGNOSIS — R443 Hallucinations, unspecified: Secondary | ICD-10-CM

## 2019-05-16 DIAGNOSIS — F3341 Major depressive disorder, recurrent, in partial remission: Secondary | ICD-10-CM

## 2019-05-16 DIAGNOSIS — F4310 Post-traumatic stress disorder, unspecified: Secondary | ICD-10-CM

## 2019-05-16 NOTE — Patient Instructions (Signed)
1. Continue Effexor 225 mg daily  2.ContinueAbilify20mg  once a day 3.Next appointment: 5/17 at 1 PM
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
## 2019-05-23 ENCOUNTER — Ambulatory Visit (HOSPITAL_COMMUNITY): Payer: Medicare Other | Admitting: Psychiatry

## 2019-05-23 DIAGNOSIS — H26493 Other secondary cataract, bilateral: Secondary | ICD-10-CM | POA: Diagnosis not present

## 2019-05-23 DIAGNOSIS — H353131 Nonexudative age-related macular degeneration, bilateral, early dry stage: Secondary | ICD-10-CM | POA: Diagnosis not present

## 2019-05-23 DIAGNOSIS — Z7984 Long term (current) use of oral hypoglycemic drugs: Secondary | ICD-10-CM | POA: Diagnosis not present

## 2019-05-23 DIAGNOSIS — E119 Type 2 diabetes mellitus without complications: Secondary | ICD-10-CM | POA: Diagnosis not present

## 2019-05-23 DIAGNOSIS — Z961 Presence of intraocular lens: Secondary | ICD-10-CM | POA: Diagnosis not present

## 2019-05-24 ENCOUNTER — Ambulatory Visit (HOSPITAL_COMMUNITY): Payer: Medicare Other | Admitting: Psychiatry

## 2019-05-29 DIAGNOSIS — E119 Type 2 diabetes mellitus without complications: Secondary | ICD-10-CM | POA: Diagnosis not present

## 2019-06-22 DIAGNOSIS — Z79891 Long term (current) use of opiate analgesic: Secondary | ICD-10-CM | POA: Diagnosis not present

## 2019-06-22 DIAGNOSIS — Z79899 Other long term (current) drug therapy: Secondary | ICD-10-CM | POA: Diagnosis not present

## 2019-06-22 DIAGNOSIS — M549 Dorsalgia, unspecified: Secondary | ICD-10-CM | POA: Diagnosis not present

## 2019-06-22 DIAGNOSIS — M47817 Spondylosis without myelopathy or radiculopathy, lumbosacral region: Secondary | ICD-10-CM | POA: Diagnosis not present

## 2019-06-22 DIAGNOSIS — M5136 Other intervertebral disc degeneration, lumbar region: Secondary | ICD-10-CM | POA: Diagnosis not present

## 2019-06-22 DIAGNOSIS — G894 Chronic pain syndrome: Secondary | ICD-10-CM | POA: Diagnosis not present

## 2019-06-28 DIAGNOSIS — E119 Type 2 diabetes mellitus without complications: Secondary | ICD-10-CM | POA: Diagnosis not present

## 2019-07-07 DIAGNOSIS — H26493 Other secondary cataract, bilateral: Secondary | ICD-10-CM | POA: Diagnosis not present

## 2019-07-07 DIAGNOSIS — H353131 Nonexudative age-related macular degeneration, bilateral, early dry stage: Secondary | ICD-10-CM | POA: Diagnosis not present

## 2019-07-19 DIAGNOSIS — E1151 Type 2 diabetes mellitus with diabetic peripheral angiopathy without gangrene: Secondary | ICD-10-CM | POA: Diagnosis not present

## 2019-07-19 DIAGNOSIS — Z299 Encounter for prophylactic measures, unspecified: Secondary | ICD-10-CM | POA: Diagnosis not present

## 2019-07-19 DIAGNOSIS — I10 Essential (primary) hypertension: Secondary | ICD-10-CM | POA: Diagnosis not present

## 2019-07-19 DIAGNOSIS — F17210 Nicotine dependence, cigarettes, uncomplicated: Secondary | ICD-10-CM | POA: Diagnosis not present

## 2019-07-19 DIAGNOSIS — E1165 Type 2 diabetes mellitus with hyperglycemia: Secondary | ICD-10-CM | POA: Diagnosis not present

## 2019-07-19 DIAGNOSIS — J449 Chronic obstructive pulmonary disease, unspecified: Secondary | ICD-10-CM | POA: Diagnosis not present

## 2019-07-20 DIAGNOSIS — G894 Chronic pain syndrome: Secondary | ICD-10-CM | POA: Diagnosis not present

## 2019-07-20 DIAGNOSIS — M5136 Other intervertebral disc degeneration, lumbar region: Secondary | ICD-10-CM | POA: Diagnosis not present

## 2019-07-20 DIAGNOSIS — M549 Dorsalgia, unspecified: Secondary | ICD-10-CM | POA: Diagnosis not present

## 2019-07-20 DIAGNOSIS — M47817 Spondylosis without myelopathy or radiculopathy, lumbosacral region: Secondary | ICD-10-CM | POA: Diagnosis not present

## 2019-07-29 DIAGNOSIS — E119 Type 2 diabetes mellitus without complications: Secondary | ICD-10-CM | POA: Diagnosis not present

## 2019-07-29 DIAGNOSIS — I10 Essential (primary) hypertension: Secondary | ICD-10-CM | POA: Diagnosis not present

## 2019-08-03 DIAGNOSIS — M545 Low back pain: Secondary | ICD-10-CM | POA: Diagnosis not present

## 2019-08-03 DIAGNOSIS — M47816 Spondylosis without myelopathy or radiculopathy, lumbar region: Secondary | ICD-10-CM | POA: Diagnosis not present

## 2019-08-03 DIAGNOSIS — M4316 Spondylolisthesis, lumbar region: Secondary | ICD-10-CM | POA: Diagnosis not present

## 2019-08-03 DIAGNOSIS — M5126 Other intervertebral disc displacement, lumbar region: Secondary | ICD-10-CM | POA: Diagnosis not present
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
## 2019-08-15 ENCOUNTER — Telehealth (INDEPENDENT_AMBULATORY_CARE_PROVIDER_SITE_OTHER): Payer: Medicare Other | Admitting: Psychiatry

## 2019-08-15 ENCOUNTER — Other Ambulatory Visit: Payer: Self-pay

## 2019-08-15 ENCOUNTER — Encounter (HOSPITAL_COMMUNITY): Payer: Self-pay | Admitting: Psychiatry

## 2019-08-15 DIAGNOSIS — F330 Major depressive disorder, recurrent, mild: Secondary | ICD-10-CM

## 2019-08-15 MED ORDER — SERTRALINE HCL 50 MG PO TABS: ORAL_TABLET | ORAL | 1 refills | Status: DC

## 2019-08-15 NOTE — Patient Instructions (Signed)
Week 1 Decrease venlafaixne 150 mg daily, start sertraline 25 mg daily  Week 2 Decrease venlafaxine 75 mg daily, increase sertraline 50 mg daily 2.ContinueAbilify20mg  once a day 3.Next appointment: 7/6 at 1:20  PM

## 2019-08-17 ENCOUNTER — Telehealth (HOSPITAL_COMMUNITY): Payer: Self-pay | Admitting: *Deleted

## 2019-08-17 NOTE — Telephone Encounter (Signed)
??   CLARIFICATION ON THE INSTRUCTION??   I SEE THE NOTE WHAT'S THE TRANSITION TO THE SERTRALINE???

## 2019-08-17 NOTE — Telephone Encounter (Signed)
Spoke with Rx  & per provider's clarification: Please instruct the pharmacy and the patient as below.  Week 1 Decrease venlafaxine 150 mg daily, start sertraline 25 mg daily  Week 2 Decrease venlafaxine 75 mg daily, increase sertraline 50 mg daily Week 3- Discontinue venlafaxine, continue sertraline 50 mg daily   Rx pill packs med's  & will package accordingly & explain to patient

## 2019-08-17 NOTE — Telephone Encounter (Signed)
Please contact the patient to instruct the above.

## 2019-08-17 NOTE — Telephone Encounter (Signed)
Rx [facility]) CALLED NEEDS CLARIFICATION ON 08/15/19 VISIT::  Week 1 Decrease venlafaxine 150 mg daily, start sertraline 25 mg daily  Week 2 Decrease venlafaxine 75 mg daily, increase sertraline 50 mg daily  PATIENT TOLD THEM SHE COULDN'T REMEMBER HOW SHE WAS INSTRUCTED TO BEGIN THE  SERTRALINE

## 2019-08-17 NOTE — Telephone Encounter (Signed)
Please instruct the pharmacy and the patient as below.  Week 1 Decrease venlafaxine 150 mg daily, start sertraline 25 mg daily  Week 2 Decrease venlafaxine 75 mg daily, increase sertraline 50 mg daily Week 3- Discontinue venlafaxine, continue sertraline 50 mg daily

## 2019-08-28 DIAGNOSIS — E119 Type 2 diabetes mellitus without complications: Secondary | ICD-10-CM | POA: Diagnosis not present

## 2019-08-28 DIAGNOSIS — I10 Essential (primary) hypertension: Secondary | ICD-10-CM | POA: Diagnosis not present

## 2019-08-29 DIAGNOSIS — I10 Essential (primary) hypertension: Secondary | ICD-10-CM | POA: Diagnosis not present

## 2019-09-22 DIAGNOSIS — Z299 Encounter for prophylactic measures, unspecified: Secondary | ICD-10-CM | POA: Diagnosis not present

## 2019-09-22 DIAGNOSIS — S20469A Insect bite (nonvenomous) of unspecified back wall of thorax, initial encounter: Secondary | ICD-10-CM | POA: Diagnosis not present

## 2019-09-22 DIAGNOSIS — F17210 Nicotine dependence, cigarettes, uncomplicated: Secondary | ICD-10-CM | POA: Diagnosis not present

## 2019-09-22 DIAGNOSIS — I10 Essential (primary) hypertension: Secondary | ICD-10-CM | POA: Diagnosis not present

## 2019-09-22 DIAGNOSIS — J441 Chronic obstructive pulmonary disease with (acute) exacerbation: Secondary | ICD-10-CM | POA: Diagnosis not present

## 2019-09-22 DIAGNOSIS — W57XXXA Bitten or stung by nonvenomous insect and other nonvenomous arthropods, initial encounter: Secondary | ICD-10-CM | POA: Diagnosis not present

## 2019-09-22 DIAGNOSIS — L089 Local infection of the skin and subcutaneous tissue, unspecified: Secondary | ICD-10-CM | POA: Diagnosis not present

## 2019-09-26 DIAGNOSIS — W57XXXA Bitten or stung by nonvenomous insect and other nonvenomous arthropods, initial encounter: Secondary | ICD-10-CM | POA: Diagnosis not present

## 2019-09-26 DIAGNOSIS — I10 Essential (primary) hypertension: Secondary | ICD-10-CM | POA: Diagnosis not present

## 2019-09-26 DIAGNOSIS — Z299 Encounter for prophylactic measures, unspecified: Secondary | ICD-10-CM | POA: Diagnosis not present

## 2019-09-26 DIAGNOSIS — L089 Local infection of the skin and subcutaneous tissue, unspecified: Secondary | ICD-10-CM | POA: Diagnosis not present

## 2019-09-26 DIAGNOSIS — F17210 Nicotine dependence, cigarettes, uncomplicated: Secondary | ICD-10-CM | POA: Diagnosis not present

## 2019-09-26 DIAGNOSIS — S20469A Insect bite (nonvenomous) of unspecified back wall of thorax, initial encounter: Secondary | ICD-10-CM | POA: Diagnosis not present
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
## 2019-09-28 DIAGNOSIS — I10 Essential (primary) hypertension: Secondary | ICD-10-CM | POA: Diagnosis not present

## 2019-09-28 DIAGNOSIS — E119 Type 2 diabetes mellitus without complications: Secondary | ICD-10-CM | POA: Diagnosis not present

## 2019-10-04 ENCOUNTER — Other Ambulatory Visit: Payer: Self-pay

## 2019-10-04 ENCOUNTER — Encounter (HOSPITAL_COMMUNITY): Payer: Self-pay | Admitting: Psychiatry

## 2019-10-04 ENCOUNTER — Telehealth (INDEPENDENT_AMBULATORY_CARE_PROVIDER_SITE_OTHER): Payer: Medicare Other | Admitting: Psychiatry

## 2019-10-04 DIAGNOSIS — I10 Essential (primary) hypertension: Secondary | ICD-10-CM | POA: Diagnosis not present

## 2019-10-04 DIAGNOSIS — R443 Hallucinations, unspecified: Secondary | ICD-10-CM | POA: Diagnosis not present

## 2019-10-04 DIAGNOSIS — F4310 Post-traumatic stress disorder, unspecified: Secondary | ICD-10-CM | POA: Diagnosis not present

## 2019-10-04 DIAGNOSIS — Z299 Encounter for prophylactic measures, unspecified: Secondary | ICD-10-CM | POA: Diagnosis not present

## 2019-10-04 DIAGNOSIS — F330 Major depressive disorder, recurrent, mild: Secondary | ICD-10-CM | POA: Diagnosis not present

## 2019-10-04 DIAGNOSIS — Z7189 Other specified counseling: Secondary | ICD-10-CM | POA: Diagnosis not present

## 2019-10-04 DIAGNOSIS — Z0000 Encounter for general adult medical examination without abnormal findings: Secondary | ICD-10-CM | POA: Diagnosis not present

## 2019-10-04 MED ORDER — SERTRALINE HCL 100 MG PO TABS: 100.0000 mg | ORAL_TABLET | Freq: Every day | ORAL | 0 refills | Status: DC

## 2019-10-04 MED ORDER — ARIPIPRAZOLE 20 MG PO TABS: 20.0000 mg | ORAL_TABLET | Freq: Every day | ORAL | 0 refills | Status: DC

## 2019-10-04 NOTE — Patient Instructions (Signed)
1. Increase sertraline 100 mg daily  2.ContinueAbilify20mg  once a day 3.Next appointment:10/5 at 1:20  PM

## 2019-10-05 DIAGNOSIS — E559 Vitamin D deficiency, unspecified: Secondary | ICD-10-CM | POA: Diagnosis not present

## 2019-10-05 DIAGNOSIS — Z79899 Other long term (current) drug therapy: Secondary | ICD-10-CM | POA: Diagnosis not present

## 2019-10-05 DIAGNOSIS — E7800 Pure hypercholesterolemia, unspecified: Secondary | ICD-10-CM | POA: Diagnosis not present

## 2019-10-05 DIAGNOSIS — R5383 Other fatigue: Secondary | ICD-10-CM | POA: Diagnosis not present

## 2019-10-10 DIAGNOSIS — J301 Allergic rhinitis due to pollen: Secondary | ICD-10-CM | POA: Diagnosis not present

## 2019-10-25 DIAGNOSIS — E1165 Type 2 diabetes mellitus with hyperglycemia: Secondary | ICD-10-CM | POA: Diagnosis not present

## 2019-10-25 DIAGNOSIS — J449 Chronic obstructive pulmonary disease, unspecified: Secondary | ICD-10-CM | POA: Diagnosis not present

## 2019-10-25 DIAGNOSIS — I10 Essential (primary) hypertension: Secondary | ICD-10-CM | POA: Diagnosis not present

## 2019-10-25 DIAGNOSIS — F17210 Nicotine dependence, cigarettes, uncomplicated: Secondary | ICD-10-CM | POA: Diagnosis not present

## 2019-10-25 DIAGNOSIS — Z299 Encounter for prophylactic measures, unspecified: Secondary | ICD-10-CM | POA: Diagnosis not present

## 2019-10-27 DIAGNOSIS — I209 Angina pectoris, unspecified: Secondary | ICD-10-CM | POA: Diagnosis not present

## 2019-10-27 DIAGNOSIS — I10 Essential (primary) hypertension: Secondary | ICD-10-CM | POA: Diagnosis not present

## 2019-10-27 DIAGNOSIS — E782 Mixed hyperlipidemia: Secondary | ICD-10-CM | POA: Diagnosis not present

## 2019-10-27 DIAGNOSIS — I350 Nonrheumatic aortic (valve) stenosis: Secondary | ICD-10-CM | POA: Diagnosis not present

## 2019-11-17 DIAGNOSIS — Z299 Encounter for prophylactic measures, unspecified: Secondary | ICD-10-CM | POA: Diagnosis not present

## 2019-11-17 DIAGNOSIS — R5383 Other fatigue: Secondary | ICD-10-CM | POA: Diagnosis not present

## 2019-11-17 DIAGNOSIS — R21 Rash and other nonspecific skin eruption: Secondary | ICD-10-CM | POA: Diagnosis not present

## 2019-11-17 DIAGNOSIS — E1165 Type 2 diabetes mellitus with hyperglycemia: Secondary | ICD-10-CM | POA: Diagnosis not present

## 2019-11-17 DIAGNOSIS — J439 Emphysema, unspecified: Secondary | ICD-10-CM | POA: Diagnosis not present

## 2019-11-17 DIAGNOSIS — I10 Essential (primary) hypertension: Secondary | ICD-10-CM | POA: Diagnosis not present

## 2019-11-17 DIAGNOSIS — R0602 Shortness of breath: Secondary | ICD-10-CM | POA: Diagnosis not present

## 2019-11-17 DIAGNOSIS — F17210 Nicotine dependence, cigarettes, uncomplicated: Secondary | ICD-10-CM | POA: Diagnosis not present

## 2019-11-22 DIAGNOSIS — I509 Heart failure, unspecified: Secondary | ICD-10-CM | POA: Diagnosis not present

## 2019-11-29 DIAGNOSIS — E1165 Type 2 diabetes mellitus with hyperglycemia: Secondary | ICD-10-CM | POA: Diagnosis not present

## 2019-11-29 DIAGNOSIS — Z720 Tobacco use: Secondary | ICD-10-CM | POA: Diagnosis not present

## 2019-11-29 DIAGNOSIS — I10 Essential (primary) hypertension: Secondary | ICD-10-CM | POA: Diagnosis not present

## 2019-11-29 DIAGNOSIS — J449 Chronic obstructive pulmonary disease, unspecified: Secondary | ICD-10-CM | POA: Diagnosis not present

## 2019-12-01 DIAGNOSIS — E1165 Type 2 diabetes mellitus with hyperglycemia: Secondary | ICD-10-CM | POA: Diagnosis not present

## 2019-12-01 DIAGNOSIS — Z299 Encounter for prophylactic measures, unspecified: Secondary | ICD-10-CM | POA: Diagnosis not present

## 2019-12-01 DIAGNOSIS — F17210 Nicotine dependence, cigarettes, uncomplicated: Secondary | ICD-10-CM | POA: Diagnosis not present

## 2019-12-01 DIAGNOSIS — J449 Chronic obstructive pulmonary disease, unspecified: Secondary | ICD-10-CM | POA: Diagnosis not present

## 2019-12-01 DIAGNOSIS — I10 Essential (primary) hypertension: Secondary | ICD-10-CM | POA: Diagnosis not present

## 2019-12-29 DIAGNOSIS — Z720 Tobacco use: Secondary | ICD-10-CM | POA: Diagnosis not present

## 2019-12-29 DIAGNOSIS — E1165 Type 2 diabetes mellitus with hyperglycemia: Secondary | ICD-10-CM | POA: Diagnosis not present

## 2019-12-29 DIAGNOSIS — I10 Essential (primary) hypertension: Secondary | ICD-10-CM | POA: Diagnosis not present

## 2019-12-29 DIAGNOSIS — J441 Chronic obstructive pulmonary disease with (acute) exacerbation: Secondary | ICD-10-CM | POA: Diagnosis not present
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
## 2020-01-03 ENCOUNTER — Other Ambulatory Visit: Payer: Self-pay

## 2020-01-03 ENCOUNTER — Encounter (HOSPITAL_COMMUNITY): Payer: Self-pay | Admitting: Psychiatry

## 2020-01-03 ENCOUNTER — Telehealth (INDEPENDENT_AMBULATORY_CARE_PROVIDER_SITE_OTHER): Payer: Medicare Other | Admitting: Psychiatry

## 2020-01-03 DIAGNOSIS — F4310 Post-traumatic stress disorder, unspecified: Secondary | ICD-10-CM

## 2020-01-03 DIAGNOSIS — F330 Major depressive disorder, recurrent, mild: Secondary | ICD-10-CM | POA: Diagnosis not present

## 2020-01-03 MED ORDER — SERTRALINE HCL 100 MG PO TABS
100.0000 mg | ORAL_TABLET | Freq: Every day | ORAL | 0 refills | Status: DC
Start: 2020-01-03 — End: 2020-04-18

## 2020-01-03 MED ORDER — ARIPIPRAZOLE 20 MG PO TABS
20.0000 mg | ORAL_TABLET | Freq: Every day | ORAL | 0 refills | Status: DC
Start: 2020-01-03 — End: 2020-04-20

## 2020-01-31 DIAGNOSIS — I509 Heart failure, unspecified: Secondary | ICD-10-CM | POA: Diagnosis not present

## 2020-01-31 DIAGNOSIS — I10 Essential (primary) hypertension: Secondary | ICD-10-CM | POA: Diagnosis not present

## 2020-01-31 DIAGNOSIS — E1165 Type 2 diabetes mellitus with hyperglycemia: Secondary | ICD-10-CM | POA: Diagnosis not present

## 2020-01-31 DIAGNOSIS — F17210 Nicotine dependence, cigarettes, uncomplicated: Secondary | ICD-10-CM | POA: Diagnosis not present

## 2020-01-31 DIAGNOSIS — Z299 Encounter for prophylactic measures, unspecified: Secondary | ICD-10-CM | POA: Diagnosis not present

## 2020-01-31 DIAGNOSIS — B379 Candidiasis, unspecified: Secondary | ICD-10-CM | POA: Diagnosis not present

## 2020-01-31 DIAGNOSIS — E1151 Type 2 diabetes mellitus with diabetic peripheral angiopathy without gangrene: Secondary | ICD-10-CM | POA: Diagnosis not present

## 2020-02-28 DIAGNOSIS — E1151 Type 2 diabetes mellitus with diabetic peripheral angiopathy without gangrene: Secondary | ICD-10-CM | POA: Diagnosis not present

## 2020-02-28 DIAGNOSIS — I110 Hypertensive heart disease with heart failure: Secondary | ICD-10-CM | POA: Diagnosis not present

## 2020-02-28 DIAGNOSIS — I509 Heart failure, unspecified: Secondary | ICD-10-CM | POA: Diagnosis not present

## 2020-02-28 DIAGNOSIS — K219 Gastro-esophageal reflux disease without esophagitis: Secondary | ICD-10-CM | POA: Diagnosis not present

## 2020-03-30 DIAGNOSIS — I10 Essential (primary) hypertension: Secondary | ICD-10-CM | POA: Diagnosis not present

## 2020-03-30 DIAGNOSIS — E1165 Type 2 diabetes mellitus with hyperglycemia: Secondary | ICD-10-CM | POA: Diagnosis not present

## 2020-03-30 DIAGNOSIS — J449 Chronic obstructive pulmonary disease, unspecified: Secondary | ICD-10-CM | POA: Diagnosis not present

## 2020-03-30 DIAGNOSIS — Z720 Tobacco use: Secondary | ICD-10-CM | POA: Diagnosis not present
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
## 2020-04-17 ENCOUNTER — Telehealth (HOSPITAL_COMMUNITY): Payer: Medicare Other | Admitting: Psychiatry

## 2020-04-17 ENCOUNTER — Telehealth: Payer: Medicaid Other | Admitting: Psychiatry

## 2020-04-18 ENCOUNTER — Telehealth: Payer: Self-pay

## 2020-04-18 ENCOUNTER — Other Ambulatory Visit (HOSPITAL_COMMUNITY): Payer: Self-pay | Admitting: Psychiatry

## 2020-04-18 MED ORDER — SERTRALINE HCL 100 MG PO TABS
100.0000 mg | ORAL_TABLET | Freq: Every day | ORAL | 0 refills | Status: DC
Start: 2020-04-18 — End: 2020-06-22

## 2020-04-18 NOTE — Telephone Encounter (Signed)
Received fax requesting a refill on the sertraline   sertraline (ZOLOFT) 100 MG tablet Medication Date: 01/03/2020 Department: [facility] Ordering/Authorizing: Neysa Hotter, MD    Order Providers  Prescribing Provider Encounter Provider  Neysa Hotter, MD Neysa Hotter, MD   Outpatient Medication Detail   Disp Refills Start End   sertraline (ZOLOFT) 100 MG tablet 90 tablet 0 01/03/2020    Sig - Route: Take 1 tablet (100 mg total) by mouth daily. - Oral   Sent to pharmacy as: sertraline (ZOLOFT) 100 MG tablet   E-Prescribing Status: Receipt confirmed by pharmacy (01/03/2020 1:41 PM EDT)    Pharmacy  [facility] - [place], [place] - [address]

## 2020-04-18 NOTE — Telephone Encounter (Signed)
Ordered

## 2020-04-20 ENCOUNTER — Telehealth (INDEPENDENT_AMBULATORY_CARE_PROVIDER_SITE_OTHER): Payer: Medicare HMO | Admitting: Psychiatry

## 2020-04-20 ENCOUNTER — Other Ambulatory Visit: Payer: Self-pay

## 2020-04-20 ENCOUNTER — Encounter: Payer: Self-pay | Admitting: Psychiatry

## 2020-04-20 DIAGNOSIS — F330 Major depressive disorder, recurrent, mild: Secondary | ICD-10-CM | POA: Diagnosis not present

## 2020-04-20 DIAGNOSIS — F4310 Post-traumatic stress disorder, unspecified: Secondary | ICD-10-CM

## 2020-04-20 MED ORDER — ARIPIPRAZOLE 15 MG PO TABS
15.0000 mg | ORAL_TABLET | Freq: Every day | ORAL | 0 refills | Status: DC
Start: 2020-04-20 — End: 2020-06-22

## 2020-04-26 DIAGNOSIS — E782 Mixed hyperlipidemia: Secondary | ICD-10-CM | POA: Diagnosis not present

## 2020-04-26 DIAGNOSIS — I350 Nonrheumatic aortic (valve) stenosis: Secondary | ICD-10-CM | POA: Diagnosis not present

## 2020-04-26 DIAGNOSIS — I10 Essential (primary) hypertension: Secondary | ICD-10-CM | POA: Diagnosis not present

## 2020-04-30 DIAGNOSIS — Z720 Tobacco use: Secondary | ICD-10-CM | POA: Diagnosis not present

## 2020-04-30 DIAGNOSIS — E1165 Type 2 diabetes mellitus with hyperglycemia: Secondary | ICD-10-CM | POA: Diagnosis not present

## 2020-04-30 DIAGNOSIS — J449 Chronic obstructive pulmonary disease, unspecified: Secondary | ICD-10-CM | POA: Diagnosis not present

## 2020-04-30 DIAGNOSIS — I10 Essential (primary) hypertension: Secondary | ICD-10-CM | POA: Diagnosis not present

## 2020-05-07 DIAGNOSIS — I10 Essential (primary) hypertension: Secondary | ICD-10-CM | POA: Diagnosis not present

## 2020-05-07 DIAGNOSIS — F17210 Nicotine dependence, cigarettes, uncomplicated: Secondary | ICD-10-CM | POA: Diagnosis not present

## 2020-05-07 DIAGNOSIS — E261 Secondary hyperaldosteronism: Secondary | ICD-10-CM | POA: Diagnosis not present

## 2020-05-07 DIAGNOSIS — E1151 Type 2 diabetes mellitus with diabetic peripheral angiopathy without gangrene: Secondary | ICD-10-CM | POA: Diagnosis not present

## 2020-05-07 DIAGNOSIS — Z23 Encounter for immunization: Secondary | ICD-10-CM | POA: Diagnosis not present

## 2020-05-07 DIAGNOSIS — E1165 Type 2 diabetes mellitus with hyperglycemia: Secondary | ICD-10-CM | POA: Diagnosis not present

## 2020-05-07 DIAGNOSIS — I509 Heart failure, unspecified: Secondary | ICD-10-CM | POA: Diagnosis not present

## 2020-05-07 DIAGNOSIS — Z299 Encounter for prophylactic measures, unspecified: Secondary | ICD-10-CM | POA: Diagnosis not present

## 2020-06-13 DIAGNOSIS — I10 Essential (primary) hypertension: Secondary | ICD-10-CM | POA: Diagnosis not present

## 2020-06-13 DIAGNOSIS — I119 Hypertensive heart disease without heart failure: Secondary | ICD-10-CM | POA: Diagnosis not present

## 2020-06-13 DIAGNOSIS — Z6828 Body mass index (BMI) 28.0-28.9, adult: Secondary | ICD-10-CM | POA: Diagnosis not present

## 2020-06-13 DIAGNOSIS — E261 Secondary hyperaldosteronism: Secondary | ICD-10-CM | POA: Diagnosis not present

## 2020-06-13 DIAGNOSIS — Z299 Encounter for prophylactic measures, unspecified: Secondary | ICD-10-CM | POA: Diagnosis not present

## 2020-06-13 DIAGNOSIS — F17210 Nicotine dependence, cigarettes, uncomplicated: Secondary | ICD-10-CM | POA: Diagnosis not present
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
## 2020-06-22 ENCOUNTER — Telehealth (INDEPENDENT_AMBULATORY_CARE_PROVIDER_SITE_OTHER): Payer: Medicare HMO | Admitting: Psychiatry

## 2020-06-22 ENCOUNTER — Encounter: Payer: Self-pay | Admitting: Psychiatry

## 2020-06-22 ENCOUNTER — Other Ambulatory Visit: Payer: Self-pay

## 2020-06-22 DIAGNOSIS — F330 Major depressive disorder, recurrent, mild: Secondary | ICD-10-CM | POA: Diagnosis not present

## 2020-06-22 DIAGNOSIS — F4310 Post-traumatic stress disorder, unspecified: Secondary | ICD-10-CM | POA: Diagnosis not present

## 2020-06-22 MED ORDER — SERTRALINE HCL 100 MG PO TABS: 150.0000 mg | ORAL_TABLET | Freq: Every day | ORAL | 1 refills | Status: DC

## 2020-06-22 MED ORDER — ARIPIPRAZOLE 15 MG PO TABS
15.0000 mg | ORAL_TABLET | Freq: Every day | ORAL | 0 refills | Status: DC
Start: 2020-07-19 — End: 2020-10-31

## 2020-06-22 NOTE — Patient Instructions (Signed)
1. Continue sertraline 150 mg daily 2.Continue Abilify15mg  once a day  3.Next appointment:5/9 at 9 AM in person visit. Please arrive by 8:45 AM

## 2020-06-27 DIAGNOSIS — J449 Chronic obstructive pulmonary disease, unspecified: Secondary | ICD-10-CM | POA: Diagnosis not present

## 2020-06-27 DIAGNOSIS — I110 Hypertensive heart disease with heart failure: Secondary | ICD-10-CM | POA: Diagnosis not present

## 2020-06-27 DIAGNOSIS — Z720 Tobacco use: Secondary | ICD-10-CM | POA: Diagnosis not present

## 2020-06-27 DIAGNOSIS — I10 Essential (primary) hypertension: Secondary | ICD-10-CM | POA: Diagnosis not present
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
## 2020-08-01 ENCOUNTER — Encounter: Payer: Medicare HMO | Attending: Psychology | Admitting: Psychology

## 2020-08-01 ENCOUNTER — Other Ambulatory Visit: Payer: Self-pay

## 2020-08-01 ENCOUNTER — Encounter: Payer: Self-pay | Admitting: Psychology

## 2020-08-01 DIAGNOSIS — F419 Anxiety disorder, unspecified: Secondary | ICD-10-CM | POA: Diagnosis not present

## 2020-08-01 DIAGNOSIS — Z9151 Personal history of suicidal behavior: Secondary | ICD-10-CM | POA: Insufficient documentation

## 2020-08-01 DIAGNOSIS — Z79899 Other long term (current) drug therapy: Secondary | ICD-10-CM | POA: Insufficient documentation

## 2020-08-01 DIAGNOSIS — J449 Chronic obstructive pulmonary disease, unspecified: Secondary | ICD-10-CM | POA: Insufficient documentation

## 2020-08-01 DIAGNOSIS — F4310 Post-traumatic stress disorder, unspecified: Secondary | ICD-10-CM | POA: Insufficient documentation

## 2020-08-01 DIAGNOSIS — F330 Major depressive disorder, recurrent, mild: Secondary | ICD-10-CM | POA: Diagnosis not present

## 2020-08-01 DIAGNOSIS — I10 Essential (primary) hypertension: Secondary | ICD-10-CM | POA: Diagnosis not present

## 2020-08-01 DIAGNOSIS — R69 Illness, unspecified: Secondary | ICD-10-CM | POA: Diagnosis not present

## 2020-08-01 DIAGNOSIS — E119 Type 2 diabetes mellitus without complications: Secondary | ICD-10-CM | POA: Diagnosis not present

## 2020-08-01 DIAGNOSIS — R413 Other amnesia: Secondary | ICD-10-CM | POA: Diagnosis not present
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
## 2020-08-06 ENCOUNTER — Ambulatory Visit: Payer: Medicare HMO | Admitting: Psychiatry

## 2020-08-06 ENCOUNTER — Other Ambulatory Visit: Payer: Self-pay
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
## 2020-08-09 DIAGNOSIS — I10 Essential (primary) hypertension: Secondary | ICD-10-CM | POA: Diagnosis not present

## 2020-08-09 DIAGNOSIS — R69 Illness, unspecified: Secondary | ICD-10-CM | POA: Diagnosis not present

## 2020-08-09 DIAGNOSIS — E1165 Type 2 diabetes mellitus with hyperglycemia: Secondary | ICD-10-CM | POA: Diagnosis not present

## 2020-08-09 DIAGNOSIS — Z299 Encounter for prophylactic measures, unspecified: Secondary | ICD-10-CM | POA: Diagnosis not present

## 2020-08-09 DIAGNOSIS — F17210 Nicotine dependence, cigarettes, uncomplicated: Secondary | ICD-10-CM | POA: Diagnosis not present

## 2020-08-09 DIAGNOSIS — Z6825 Body mass index (BMI) 25.0-25.9, adult: Secondary | ICD-10-CM | POA: Diagnosis not present

## 2020-08-09 DIAGNOSIS — Z0000 Encounter for general adult medical examination without abnormal findings: Secondary | ICD-10-CM | POA: Diagnosis not present

## 2020-08-09 DIAGNOSIS — J449 Chronic obstructive pulmonary disease, unspecified: Secondary | ICD-10-CM | POA: Diagnosis not present

## 2020-08-09 DIAGNOSIS — E1151 Type 2 diabetes mellitus with diabetic peripheral angiopathy without gangrene: Secondary | ICD-10-CM | POA: Diagnosis not present

## 2020-08-13 ENCOUNTER — Encounter: Payer: Self-pay | Admitting: Psychiatry

## 2020-08-13 ENCOUNTER — Ambulatory Visit (INDEPENDENT_AMBULATORY_CARE_PROVIDER_SITE_OTHER): Payer: Medicare HMO | Admitting: Psychiatry

## 2020-08-13 ENCOUNTER — Other Ambulatory Visit: Payer: Self-pay

## 2020-08-13 VITALS — BP 126/67 | HR 77 | Temp 98.3°F | Wt 139.2 lb

## 2020-08-13 DIAGNOSIS — F330 Major depressive disorder, recurrent, mild: Secondary | ICD-10-CM | POA: Diagnosis not present

## 2020-08-13 DIAGNOSIS — F411 Generalized anxiety disorder: Secondary | ICD-10-CM

## 2020-08-13 DIAGNOSIS — F4310 Post-traumatic stress disorder, unspecified: Secondary | ICD-10-CM | POA: Diagnosis not present

## 2020-08-13 DIAGNOSIS — R634 Abnormal weight loss: Secondary | ICD-10-CM

## 2020-08-24 DIAGNOSIS — E860 Dehydration: Secondary | ICD-10-CM | POA: Diagnosis not present

## 2020-08-24 DIAGNOSIS — E1169 Type 2 diabetes mellitus with other specified complication: Secondary | ICD-10-CM | POA: Diagnosis not present

## 2020-08-24 DIAGNOSIS — J9811 Atelectasis: Secondary | ICD-10-CM | POA: Diagnosis not present

## 2020-08-24 DIAGNOSIS — Z716 Tobacco abuse counseling: Secondary | ICD-10-CM | POA: Diagnosis not present

## 2020-08-24 DIAGNOSIS — R4182 Altered mental status, unspecified: Secondary | ICD-10-CM | POA: Diagnosis not present

## 2020-08-24 DIAGNOSIS — Z720 Tobacco use: Secondary | ICD-10-CM | POA: Diagnosis not present

## 2020-08-24 DIAGNOSIS — R69 Illness, unspecified: Secondary | ICD-10-CM | POA: Diagnosis not present

## 2020-08-24 DIAGNOSIS — J9601 Acute respiratory failure with hypoxia: Secondary | ICD-10-CM | POA: Diagnosis not present

## 2020-08-24 DIAGNOSIS — E785 Hyperlipidemia, unspecified: Secondary | ICD-10-CM | POA: Diagnosis not present

## 2020-08-24 DIAGNOSIS — N179 Acute kidney failure, unspecified: Secondary | ICD-10-CM | POA: Diagnosis not present

## 2020-08-24 DIAGNOSIS — I10 Essential (primary) hypertension: Secondary | ICD-10-CM | POA: Diagnosis not present

## 2020-08-24 DIAGNOSIS — I5032 Chronic diastolic (congestive) heart failure: Secondary | ICD-10-CM | POA: Diagnosis not present

## 2020-08-24 DIAGNOSIS — E870 Hyperosmolality and hypernatremia: Secondary | ICD-10-CM | POA: Diagnosis not present

## 2020-08-24 DIAGNOSIS — G9341 Metabolic encephalopathy: Secondary | ICD-10-CM | POA: Diagnosis not present

## 2020-08-24 DIAGNOSIS — E089 Diabetes mellitus due to underlying condition without complications: Secondary | ICD-10-CM | POA: Diagnosis not present

## 2020-08-24 DIAGNOSIS — E119 Type 2 diabetes mellitus without complications: Secondary | ICD-10-CM | POA: Diagnosis not present

## 2020-08-24 DIAGNOSIS — J1282 Pneumonia due to coronavirus disease 2019: Secondary | ICD-10-CM | POA: Diagnosis not present

## 2020-08-24 DIAGNOSIS — R0600 Dyspnea, unspecified: Secondary | ICD-10-CM | POA: Diagnosis not present

## 2020-08-24 DIAGNOSIS — U071 COVID-19: Secondary | ICD-10-CM | POA: Diagnosis not present

## 2020-08-24 DIAGNOSIS — Z79899 Other long term (current) drug therapy: Secondary | ICD-10-CM | POA: Diagnosis not present

## 2020-08-24 DIAGNOSIS — D649 Anemia, unspecified: Secondary | ICD-10-CM | POA: Diagnosis not present

## 2020-08-24 DIAGNOSIS — R531 Weakness: Secondary | ICD-10-CM | POA: Diagnosis not present

## 2020-08-24 DIAGNOSIS — N289 Disorder of kidney and ureter, unspecified: Secondary | ICD-10-CM | POA: Diagnosis not present

## 2020-08-25 DIAGNOSIS — I10 Essential (primary) hypertension: Secondary | ICD-10-CM | POA: Diagnosis not present

## 2020-08-25 DIAGNOSIS — N179 Acute kidney failure, unspecified: Secondary | ICD-10-CM | POA: Diagnosis not present

## 2020-08-25 DIAGNOSIS — R69 Illness, unspecified: Secondary | ICD-10-CM | POA: Diagnosis not present

## 2020-08-25 DIAGNOSIS — E785 Hyperlipidemia, unspecified: Secondary | ICD-10-CM | POA: Diagnosis not present

## 2020-08-25 DIAGNOSIS — R4182 Altered mental status, unspecified: Secondary | ICD-10-CM | POA: Diagnosis not present

## 2020-08-25 DIAGNOSIS — J1282 Pneumonia due to coronavirus disease 2019: Secondary | ICD-10-CM | POA: Diagnosis not present

## 2020-08-25 DIAGNOSIS — Z8616 Personal history of COVID-19: Secondary | ICD-10-CM

## 2020-08-25 DIAGNOSIS — E860 Dehydration: Secondary | ICD-10-CM | POA: Diagnosis not present

## 2020-08-25 DIAGNOSIS — U071 COVID-19: Secondary | ICD-10-CM | POA: Diagnosis not present

## 2020-08-25 DIAGNOSIS — E119 Type 2 diabetes mellitus without complications: Secondary | ICD-10-CM | POA: Diagnosis not present

## 2020-08-25 DIAGNOSIS — E870 Hyperosmolality and hypernatremia: Secondary | ICD-10-CM | POA: Diagnosis not present

## 2020-08-25 HISTORY — DX: Personal history of COVID-19: Z86.16

## 2020-08-25 HISTORY — DX: Acute kidney failure, unspecified: N17.9

## 2020-08-25 HISTORY — DX: Pneumonia due to coronavirus disease 2019: J12.82

## 2020-08-26 DIAGNOSIS — E870 Hyperosmolality and hypernatremia: Secondary | ICD-10-CM | POA: Diagnosis not present

## 2020-08-26 DIAGNOSIS — I10 Essential (primary) hypertension: Secondary | ICD-10-CM | POA: Diagnosis not present

## 2020-08-26 DIAGNOSIS — J1282 Pneumonia due to coronavirus disease 2019: Secondary | ICD-10-CM | POA: Diagnosis not present

## 2020-08-26 DIAGNOSIS — U071 COVID-19: Secondary | ICD-10-CM | POA: Diagnosis not present

## 2020-08-26 DIAGNOSIS — R4182 Altered mental status, unspecified: Secondary | ICD-10-CM | POA: Diagnosis not present

## 2020-08-26 DIAGNOSIS — E785 Hyperlipidemia, unspecified: Secondary | ICD-10-CM | POA: Diagnosis not present

## 2020-08-26 DIAGNOSIS — E876 Hypokalemia: Secondary | ICD-10-CM

## 2020-08-26 DIAGNOSIS — R0600 Dyspnea, unspecified: Secondary | ICD-10-CM | POA: Diagnosis not present

## 2020-08-26 DIAGNOSIS — E119 Type 2 diabetes mellitus without complications: Secondary | ICD-10-CM | POA: Diagnosis not present

## 2020-08-26 DIAGNOSIS — R69 Illness, unspecified: Secondary | ICD-10-CM | POA: Diagnosis not present

## 2020-08-26 DIAGNOSIS — N179 Acute kidney failure, unspecified: Secondary | ICD-10-CM | POA: Diagnosis not present

## 2020-08-26 DIAGNOSIS — E860 Dehydration: Secondary | ICD-10-CM | POA: Diagnosis not present

## 2020-08-26 HISTORY — DX: Hypokalemia: E87.6

## 2020-08-27 DIAGNOSIS — R69 Illness, unspecified: Secondary | ICD-10-CM | POA: Diagnosis not present

## 2020-08-27 DIAGNOSIS — J1282 Pneumonia due to coronavirus disease 2019: Secondary | ICD-10-CM | POA: Diagnosis not present

## 2020-08-27 DIAGNOSIS — E785 Hyperlipidemia, unspecified: Secondary | ICD-10-CM | POA: Diagnosis not present

## 2020-08-27 DIAGNOSIS — E860 Dehydration: Secondary | ICD-10-CM | POA: Diagnosis not present

## 2020-08-27 DIAGNOSIS — U071 COVID-19: Secondary | ICD-10-CM | POA: Diagnosis not present

## 2020-08-27 DIAGNOSIS — R4182 Altered mental status, unspecified: Secondary | ICD-10-CM | POA: Diagnosis not present

## 2020-08-27 DIAGNOSIS — N179 Acute kidney failure, unspecified: Secondary | ICD-10-CM | POA: Diagnosis not present

## 2020-08-27 DIAGNOSIS — E119 Type 2 diabetes mellitus without complications: Secondary | ICD-10-CM | POA: Diagnosis not present

## 2020-08-27 DIAGNOSIS — E870 Hyperosmolality and hypernatremia: Secondary | ICD-10-CM | POA: Diagnosis not present

## 2020-08-27 DIAGNOSIS — I10 Essential (primary) hypertension: Secondary | ICD-10-CM | POA: Diagnosis not present

## 2020-08-28 DIAGNOSIS — E785 Hyperlipidemia, unspecified: Secondary | ICD-10-CM | POA: Diagnosis not present

## 2020-08-28 DIAGNOSIS — I10 Essential (primary) hypertension: Secondary | ICD-10-CM | POA: Diagnosis not present

## 2020-08-28 DIAGNOSIS — E089 Diabetes mellitus due to underlying condition without complications: Secondary | ICD-10-CM | POA: Diagnosis not present

## 2020-08-28 DIAGNOSIS — R69 Illness, unspecified: Secondary | ICD-10-CM | POA: Diagnosis not present

## 2020-08-29 ENCOUNTER — Encounter: Payer: Medicare HMO | Admitting: Psychology

## 2020-08-29 ENCOUNTER — Other Ambulatory Visit: Payer: Self-pay

## 2020-08-29 DIAGNOSIS — E785 Hyperlipidemia, unspecified: Secondary | ICD-10-CM | POA: Diagnosis not present

## 2020-08-29 DIAGNOSIS — I5032 Chronic diastolic (congestive) heart failure: Secondary | ICD-10-CM | POA: Insufficient documentation

## 2020-08-29 DIAGNOSIS — I10 Essential (primary) hypertension: Secondary | ICD-10-CM | POA: Diagnosis not present

## 2020-08-29 DIAGNOSIS — E089 Diabetes mellitus due to underlying condition without complications: Secondary | ICD-10-CM | POA: Diagnosis not present

## 2020-08-29 DIAGNOSIS — R69 Illness, unspecified: Secondary | ICD-10-CM | POA: Diagnosis not present

## 2020-08-29 HISTORY — DX: Chronic diastolic (congestive) heart failure: I50.32

## 2020-08-30 ENCOUNTER — Encounter: Payer: Medicare HMO | Admitting: Psychology

## 2020-08-30 DIAGNOSIS — E785 Hyperlipidemia, unspecified: Secondary | ICD-10-CM | POA: Diagnosis not present

## 2020-08-30 DIAGNOSIS — R69 Illness, unspecified: Secondary | ICD-10-CM | POA: Diagnosis not present

## 2020-08-30 DIAGNOSIS — I10 Essential (primary) hypertension: Secondary | ICD-10-CM | POA: Diagnosis not present

## 2020-08-30 DIAGNOSIS — U071 COVID-19: Secondary | ICD-10-CM | POA: Diagnosis not present

## 2020-08-30 DIAGNOSIS — E089 Diabetes mellitus due to underlying condition without complications: Secondary | ICD-10-CM | POA: Diagnosis not present

## 2020-08-31 DIAGNOSIS — I5032 Chronic diastolic (congestive) heart failure: Secondary | ICD-10-CM | POA: Diagnosis not present

## 2020-08-31 DIAGNOSIS — U071 COVID-19: Secondary | ICD-10-CM | POA: Diagnosis not present

## 2020-08-31 DIAGNOSIS — R69 Illness, unspecified: Secondary | ICD-10-CM | POA: Diagnosis not present

## 2020-08-31 DIAGNOSIS — G9341 Metabolic encephalopathy: Secondary | ICD-10-CM | POA: Diagnosis not present

## 2020-08-31 DIAGNOSIS — I350 Nonrheumatic aortic (valve) stenosis: Secondary | ICD-10-CM | POA: Diagnosis not present

## 2020-08-31 DIAGNOSIS — J1282 Pneumonia due to coronavirus disease 2019: Secondary | ICD-10-CM | POA: Diagnosis not present

## 2020-08-31 DIAGNOSIS — J439 Emphysema, unspecified: Secondary | ICD-10-CM | POA: Diagnosis not present

## 2020-08-31 DIAGNOSIS — E119 Type 2 diabetes mellitus without complications: Secondary | ICD-10-CM | POA: Diagnosis not present

## 2020-08-31 DIAGNOSIS — I110 Hypertensive heart disease with heart failure: Secondary | ICD-10-CM | POA: Diagnosis not present

## 2020-08-31 DIAGNOSIS — N179 Acute kidney failure, unspecified: Secondary | ICD-10-CM | POA: Diagnosis not present

## 2020-09-04 DIAGNOSIS — N179 Acute kidney failure, unspecified: Secondary | ICD-10-CM | POA: Diagnosis not present

## 2020-09-04 DIAGNOSIS — I110 Hypertensive heart disease with heart failure: Secondary | ICD-10-CM | POA: Diagnosis not present

## 2020-09-04 DIAGNOSIS — J439 Emphysema, unspecified: Secondary | ICD-10-CM | POA: Diagnosis not present

## 2020-09-04 DIAGNOSIS — J1282 Pneumonia due to coronavirus disease 2019: Secondary | ICD-10-CM | POA: Diagnosis not present

## 2020-09-04 DIAGNOSIS — I350 Nonrheumatic aortic (valve) stenosis: Secondary | ICD-10-CM | POA: Diagnosis not present

## 2020-09-04 DIAGNOSIS — R69 Illness, unspecified: Secondary | ICD-10-CM | POA: Diagnosis not present

## 2020-09-04 DIAGNOSIS — U071 COVID-19: Secondary | ICD-10-CM | POA: Diagnosis not present

## 2020-09-04 DIAGNOSIS — E119 Type 2 diabetes mellitus without complications: Secondary | ICD-10-CM | POA: Diagnosis not present

## 2020-09-04 DIAGNOSIS — I5032 Chronic diastolic (congestive) heart failure: Secondary | ICD-10-CM | POA: Diagnosis not present

## 2020-09-04 DIAGNOSIS — G9341 Metabolic encephalopathy: Secondary | ICD-10-CM | POA: Diagnosis not present

## 2020-09-05 DIAGNOSIS — Z6827 Body mass index (BMI) 27.0-27.9, adult: Secondary | ICD-10-CM | POA: Diagnosis not present

## 2020-09-05 DIAGNOSIS — Z299 Encounter for prophylactic measures, unspecified: Secondary | ICD-10-CM | POA: Diagnosis not present

## 2020-09-05 DIAGNOSIS — I509 Heart failure, unspecified: Secondary | ICD-10-CM | POA: Diagnosis not present

## 2020-09-05 DIAGNOSIS — R69 Illness, unspecified: Secondary | ICD-10-CM | POA: Diagnosis not present

## 2020-09-05 DIAGNOSIS — I10 Essential (primary) hypertension: Secondary | ICD-10-CM | POA: Diagnosis not present

## 2020-09-06 DIAGNOSIS — J1282 Pneumonia due to coronavirus disease 2019: Secondary | ICD-10-CM | POA: Diagnosis not present

## 2020-09-06 DIAGNOSIS — U071 COVID-19: Secondary | ICD-10-CM | POA: Diagnosis not present

## 2020-09-06 DIAGNOSIS — G9341 Metabolic encephalopathy: Secondary | ICD-10-CM | POA: Diagnosis not present

## 2020-09-06 DIAGNOSIS — R69 Illness, unspecified: Secondary | ICD-10-CM | POA: Diagnosis not present

## 2020-09-06 DIAGNOSIS — N179 Acute kidney failure, unspecified: Secondary | ICD-10-CM | POA: Diagnosis not present

## 2020-09-06 DIAGNOSIS — I350 Nonrheumatic aortic (valve) stenosis: Secondary | ICD-10-CM | POA: Diagnosis not present

## 2020-09-06 DIAGNOSIS — I110 Hypertensive heart disease with heart failure: Secondary | ICD-10-CM | POA: Diagnosis not present

## 2020-09-06 DIAGNOSIS — E119 Type 2 diabetes mellitus without complications: Secondary | ICD-10-CM | POA: Diagnosis not present

## 2020-09-06 DIAGNOSIS — J439 Emphysema, unspecified: Secondary | ICD-10-CM | POA: Diagnosis not present

## 2020-09-06 DIAGNOSIS — I5032 Chronic diastolic (congestive) heart failure: Secondary | ICD-10-CM | POA: Diagnosis not present

## 2020-09-07 DIAGNOSIS — J1282 Pneumonia due to coronavirus disease 2019: Secondary | ICD-10-CM | POA: Diagnosis not present

## 2020-09-07 DIAGNOSIS — I110 Hypertensive heart disease with heart failure: Secondary | ICD-10-CM | POA: Diagnosis not present

## 2020-09-07 DIAGNOSIS — R69 Illness, unspecified: Secondary | ICD-10-CM | POA: Diagnosis not present

## 2020-09-07 DIAGNOSIS — E119 Type 2 diabetes mellitus without complications: Secondary | ICD-10-CM | POA: Diagnosis not present

## 2020-09-07 DIAGNOSIS — N179 Acute kidney failure, unspecified: Secondary | ICD-10-CM | POA: Diagnosis not present

## 2020-09-07 DIAGNOSIS — J439 Emphysema, unspecified: Secondary | ICD-10-CM | POA: Diagnosis not present

## 2020-09-07 DIAGNOSIS — G9341 Metabolic encephalopathy: Secondary | ICD-10-CM | POA: Diagnosis not present

## 2020-09-07 DIAGNOSIS — I350 Nonrheumatic aortic (valve) stenosis: Secondary | ICD-10-CM | POA: Diagnosis not present

## 2020-09-07 DIAGNOSIS — I5032 Chronic diastolic (congestive) heart failure: Secondary | ICD-10-CM | POA: Diagnosis not present

## 2020-09-07 DIAGNOSIS — U071 COVID-19: Secondary | ICD-10-CM | POA: Diagnosis not present

## 2020-09-11 DIAGNOSIS — U071 COVID-19: Secondary | ICD-10-CM | POA: Diagnosis not present

## 2020-09-11 DIAGNOSIS — J439 Emphysema, unspecified: Secondary | ICD-10-CM | POA: Diagnosis not present

## 2020-09-11 DIAGNOSIS — R69 Illness, unspecified: Secondary | ICD-10-CM | POA: Diagnosis not present

## 2020-09-11 DIAGNOSIS — N179 Acute kidney failure, unspecified: Secondary | ICD-10-CM | POA: Diagnosis not present

## 2020-09-11 DIAGNOSIS — E119 Type 2 diabetes mellitus without complications: Secondary | ICD-10-CM | POA: Diagnosis not present

## 2020-09-11 DIAGNOSIS — I5032 Chronic diastolic (congestive) heart failure: Secondary | ICD-10-CM | POA: Diagnosis not present

## 2020-09-11 DIAGNOSIS — I110 Hypertensive heart disease with heart failure: Secondary | ICD-10-CM | POA: Diagnosis not present

## 2020-09-11 DIAGNOSIS — J1282 Pneumonia due to coronavirus disease 2019: Secondary | ICD-10-CM | POA: Diagnosis not present

## 2020-09-11 DIAGNOSIS — G9341 Metabolic encephalopathy: Secondary | ICD-10-CM | POA: Diagnosis not present

## 2020-09-11 DIAGNOSIS — I350 Nonrheumatic aortic (valve) stenosis: Secondary | ICD-10-CM | POA: Diagnosis not present

## 2020-09-13 DIAGNOSIS — I5032 Chronic diastolic (congestive) heart failure: Secondary | ICD-10-CM | POA: Diagnosis not present

## 2020-09-13 DIAGNOSIS — U071 COVID-19: Secondary | ICD-10-CM | POA: Diagnosis not present

## 2020-09-13 DIAGNOSIS — N179 Acute kidney failure, unspecified: Secondary | ICD-10-CM | POA: Diagnosis not present

## 2020-09-13 DIAGNOSIS — R69 Illness, unspecified: Secondary | ICD-10-CM | POA: Diagnosis not present

## 2020-09-13 DIAGNOSIS — E119 Type 2 diabetes mellitus without complications: Secondary | ICD-10-CM | POA: Diagnosis not present

## 2020-09-13 DIAGNOSIS — I350 Nonrheumatic aortic (valve) stenosis: Secondary | ICD-10-CM | POA: Diagnosis not present

## 2020-09-13 DIAGNOSIS — G9341 Metabolic encephalopathy: Secondary | ICD-10-CM | POA: Diagnosis not present

## 2020-09-13 DIAGNOSIS — J449 Chronic obstructive pulmonary disease, unspecified: Secondary | ICD-10-CM | POA: Diagnosis not present

## 2020-09-13 DIAGNOSIS — J1282 Pneumonia due to coronavirus disease 2019: Secondary | ICD-10-CM | POA: Diagnosis not present

## 2020-09-13 DIAGNOSIS — J439 Emphysema, unspecified: Secondary | ICD-10-CM | POA: Diagnosis not present

## 2020-09-13 DIAGNOSIS — I110 Hypertensive heart disease with heart failure: Secondary | ICD-10-CM | POA: Diagnosis not present

## 2020-09-14 DIAGNOSIS — I350 Nonrheumatic aortic (valve) stenosis: Secondary | ICD-10-CM | POA: Diagnosis not present

## 2020-09-14 DIAGNOSIS — I110 Hypertensive heart disease with heart failure: Secondary | ICD-10-CM | POA: Diagnosis not present

## 2020-09-14 DIAGNOSIS — E119 Type 2 diabetes mellitus without complications: Secondary | ICD-10-CM | POA: Diagnosis not present

## 2020-09-14 DIAGNOSIS — J439 Emphysema, unspecified: Secondary | ICD-10-CM | POA: Diagnosis not present

## 2020-09-14 DIAGNOSIS — I5032 Chronic diastolic (congestive) heart failure: Secondary | ICD-10-CM | POA: Diagnosis not present

## 2020-09-14 DIAGNOSIS — N179 Acute kidney failure, unspecified: Secondary | ICD-10-CM | POA: Diagnosis not present

## 2020-09-14 DIAGNOSIS — G9341 Metabolic encephalopathy: Secondary | ICD-10-CM | POA: Diagnosis not present

## 2020-09-14 DIAGNOSIS — J449 Chronic obstructive pulmonary disease, unspecified: Secondary | ICD-10-CM | POA: Diagnosis not present

## 2020-09-14 DIAGNOSIS — J1282 Pneumonia due to coronavirus disease 2019: Secondary | ICD-10-CM | POA: Diagnosis not present

## 2020-09-14 DIAGNOSIS — R69 Illness, unspecified: Secondary | ICD-10-CM | POA: Diagnosis not present

## 2020-09-14 DIAGNOSIS — U071 COVID-19: Secondary | ICD-10-CM | POA: Diagnosis not present

## 2020-09-19 DIAGNOSIS — I110 Hypertensive heart disease with heart failure: Secondary | ICD-10-CM | POA: Diagnosis not present

## 2020-09-19 DIAGNOSIS — J439 Emphysema, unspecified: Secondary | ICD-10-CM | POA: Diagnosis not present

## 2020-09-19 DIAGNOSIS — R69 Illness, unspecified: Secondary | ICD-10-CM | POA: Diagnosis not present

## 2020-09-19 DIAGNOSIS — I350 Nonrheumatic aortic (valve) stenosis: Secondary | ICD-10-CM | POA: Diagnosis not present

## 2020-09-19 DIAGNOSIS — J1282 Pneumonia due to coronavirus disease 2019: Secondary | ICD-10-CM | POA: Diagnosis not present

## 2020-09-19 DIAGNOSIS — I5032 Chronic diastolic (congestive) heart failure: Secondary | ICD-10-CM | POA: Diagnosis not present

## 2020-09-19 DIAGNOSIS — U071 COVID-19: Secondary | ICD-10-CM | POA: Diagnosis not present

## 2020-09-19 DIAGNOSIS — E119 Type 2 diabetes mellitus without complications: Secondary | ICD-10-CM | POA: Diagnosis not present

## 2020-09-19 DIAGNOSIS — N179 Acute kidney failure, unspecified: Secondary | ICD-10-CM | POA: Diagnosis not present

## 2020-09-19 DIAGNOSIS — G9341 Metabolic encephalopathy: Secondary | ICD-10-CM | POA: Diagnosis not present
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
## 2020-09-20 DIAGNOSIS — I5032 Chronic diastolic (congestive) heart failure: Secondary | ICD-10-CM | POA: Diagnosis not present

## 2020-09-20 DIAGNOSIS — R69 Illness, unspecified: Secondary | ICD-10-CM | POA: Diagnosis not present

## 2020-09-20 DIAGNOSIS — J1282 Pneumonia due to coronavirus disease 2019: Secondary | ICD-10-CM | POA: Diagnosis not present

## 2020-09-20 DIAGNOSIS — I110 Hypertensive heart disease with heart failure: Secondary | ICD-10-CM | POA: Diagnosis not present

## 2020-09-20 DIAGNOSIS — G9341 Metabolic encephalopathy: Secondary | ICD-10-CM | POA: Diagnosis not present

## 2020-09-20 DIAGNOSIS — I350 Nonrheumatic aortic (valve) stenosis: Secondary | ICD-10-CM | POA: Diagnosis not present

## 2020-09-20 DIAGNOSIS — N179 Acute kidney failure, unspecified: Secondary | ICD-10-CM | POA: Diagnosis not present

## 2020-09-20 DIAGNOSIS — U071 COVID-19: Secondary | ICD-10-CM | POA: Diagnosis not present

## 2020-09-20 DIAGNOSIS — J439 Emphysema, unspecified: Secondary | ICD-10-CM | POA: Diagnosis not present

## 2020-09-20 DIAGNOSIS — E119 Type 2 diabetes mellitus without complications: Secondary | ICD-10-CM | POA: Diagnosis not present

## 2020-09-24 ENCOUNTER — Telehealth: Payer: Medicare HMO | Admitting: Psychiatry

## 2020-09-24 ENCOUNTER — Telehealth: Payer: Self-pay | Admitting: Psychiatry

## 2020-09-24 ENCOUNTER — Other Ambulatory Visit: Payer: Self-pay

## 2020-09-24 NOTE — Telephone Encounter (Signed)
Sent link for video visit through Epic. Patient did not sign in. Called the patient for appointment scheduled today. The patient did not answer the phone. Left voice message to contact the office ([phone]).   ?

## 2020-09-25 DIAGNOSIS — I110 Hypertensive heart disease with heart failure: Secondary | ICD-10-CM | POA: Diagnosis not present

## 2020-09-25 DIAGNOSIS — U071 COVID-19: Secondary | ICD-10-CM | POA: Diagnosis not present

## 2020-09-25 DIAGNOSIS — J439 Emphysema, unspecified: Secondary | ICD-10-CM | POA: Diagnosis not present

## 2020-09-25 DIAGNOSIS — N179 Acute kidney failure, unspecified: Secondary | ICD-10-CM | POA: Diagnosis not present

## 2020-09-25 DIAGNOSIS — E119 Type 2 diabetes mellitus without complications: Secondary | ICD-10-CM | POA: Diagnosis not present

## 2020-09-25 DIAGNOSIS — R69 Illness, unspecified: Secondary | ICD-10-CM | POA: Diagnosis not present

## 2020-09-25 DIAGNOSIS — I5032 Chronic diastolic (congestive) heart failure: Secondary | ICD-10-CM | POA: Diagnosis not present

## 2020-09-25 DIAGNOSIS — I350 Nonrheumatic aortic (valve) stenosis: Secondary | ICD-10-CM | POA: Diagnosis not present

## 2020-09-25 DIAGNOSIS — J1282 Pneumonia due to coronavirus disease 2019: Secondary | ICD-10-CM | POA: Diagnosis not present

## 2020-09-25 DIAGNOSIS — G9341 Metabolic encephalopathy: Secondary | ICD-10-CM | POA: Diagnosis not present

## 2020-09-27 DIAGNOSIS — I10 Essential (primary) hypertension: Secondary | ICD-10-CM | POA: Diagnosis not present

## 2020-09-27 DIAGNOSIS — J449 Chronic obstructive pulmonary disease, unspecified: Secondary | ICD-10-CM | POA: Diagnosis not present

## 2020-09-27 DIAGNOSIS — Z720 Tobacco use: Secondary | ICD-10-CM | POA: Diagnosis not present

## 2020-10-03 DIAGNOSIS — U071 COVID-19: Secondary | ICD-10-CM | POA: Diagnosis not present

## 2020-10-03 DIAGNOSIS — J439 Emphysema, unspecified: Secondary | ICD-10-CM | POA: Diagnosis not present

## 2020-10-03 DIAGNOSIS — I350 Nonrheumatic aortic (valve) stenosis: Secondary | ICD-10-CM | POA: Diagnosis not present

## 2020-10-03 DIAGNOSIS — I5032 Chronic diastolic (congestive) heart failure: Secondary | ICD-10-CM | POA: Diagnosis not present

## 2020-10-03 DIAGNOSIS — R69 Illness, unspecified: Secondary | ICD-10-CM | POA: Diagnosis not present

## 2020-10-03 DIAGNOSIS — J1282 Pneumonia due to coronavirus disease 2019: Secondary | ICD-10-CM | POA: Diagnosis not present

## 2020-10-03 DIAGNOSIS — N179 Acute kidney failure, unspecified: Secondary | ICD-10-CM | POA: Diagnosis not present

## 2020-10-03 DIAGNOSIS — I110 Hypertensive heart disease with heart failure: Secondary | ICD-10-CM | POA: Diagnosis not present

## 2020-10-03 DIAGNOSIS — G9341 Metabolic encephalopathy: Secondary | ICD-10-CM | POA: Diagnosis not present

## 2020-10-03 DIAGNOSIS — E119 Type 2 diabetes mellitus without complications: Secondary | ICD-10-CM | POA: Diagnosis not present

## 2020-10-05 DIAGNOSIS — E7800 Pure hypercholesterolemia, unspecified: Secondary | ICD-10-CM | POA: Diagnosis not present

## 2020-10-05 DIAGNOSIS — J1282 Pneumonia due to coronavirus disease 2019: Secondary | ICD-10-CM | POA: Diagnosis not present

## 2020-10-05 DIAGNOSIS — N179 Acute kidney failure, unspecified: Secondary | ICD-10-CM | POA: Diagnosis not present

## 2020-10-05 DIAGNOSIS — I110 Hypertensive heart disease with heart failure: Secondary | ICD-10-CM | POA: Diagnosis not present

## 2020-10-05 DIAGNOSIS — Z7189 Other specified counseling: Secondary | ICD-10-CM | POA: Diagnosis not present

## 2020-10-05 DIAGNOSIS — Z6827 Body mass index (BMI) 27.0-27.9, adult: Secondary | ICD-10-CM | POA: Diagnosis not present

## 2020-10-05 DIAGNOSIS — N1832 Chronic kidney disease, stage 3b: Secondary | ICD-10-CM | POA: Diagnosis not present

## 2020-10-05 DIAGNOSIS — E119 Type 2 diabetes mellitus without complications: Secondary | ICD-10-CM | POA: Diagnosis not present

## 2020-10-05 DIAGNOSIS — I5032 Chronic diastolic (congestive) heart failure: Secondary | ICD-10-CM | POA: Diagnosis not present

## 2020-10-05 DIAGNOSIS — Z1339 Encounter for screening examination for other mental health and behavioral disorders: Secondary | ICD-10-CM | POA: Diagnosis not present

## 2020-10-05 DIAGNOSIS — R69 Illness, unspecified: Secondary | ICD-10-CM | POA: Diagnosis not present

## 2020-10-05 DIAGNOSIS — Z1331 Encounter for screening for depression: Secondary | ICD-10-CM | POA: Diagnosis not present

## 2020-10-05 DIAGNOSIS — Z299 Encounter for prophylactic measures, unspecified: Secondary | ICD-10-CM | POA: Diagnosis not present

## 2020-10-05 DIAGNOSIS — U071 COVID-19: Secondary | ICD-10-CM | POA: Diagnosis not present

## 2020-10-05 DIAGNOSIS — Z79899 Other long term (current) drug therapy: Secondary | ICD-10-CM | POA: Diagnosis not present

## 2020-10-05 DIAGNOSIS — Z0000 Encounter for general adult medical examination without abnormal findings: Secondary | ICD-10-CM | POA: Diagnosis not present

## 2020-10-05 DIAGNOSIS — E559 Vitamin D deficiency, unspecified: Secondary | ICD-10-CM | POA: Diagnosis not present

## 2020-10-05 DIAGNOSIS — J9611 Chronic respiratory failure with hypoxia: Secondary | ICD-10-CM | POA: Diagnosis not present

## 2020-10-05 DIAGNOSIS — I350 Nonrheumatic aortic (valve) stenosis: Secondary | ICD-10-CM | POA: Diagnosis not present

## 2020-10-05 DIAGNOSIS — G9341 Metabolic encephalopathy: Secondary | ICD-10-CM | POA: Diagnosis not present

## 2020-10-05 DIAGNOSIS — J439 Emphysema, unspecified: Secondary | ICD-10-CM | POA: Diagnosis not present

## 2020-10-09 DIAGNOSIS — U071 COVID-19: Secondary | ICD-10-CM | POA: Diagnosis not present

## 2020-10-09 DIAGNOSIS — E119 Type 2 diabetes mellitus without complications: Secondary | ICD-10-CM | POA: Diagnosis not present

## 2020-10-09 DIAGNOSIS — I350 Nonrheumatic aortic (valve) stenosis: Secondary | ICD-10-CM | POA: Diagnosis not present

## 2020-10-09 DIAGNOSIS — R69 Illness, unspecified: Secondary | ICD-10-CM | POA: Diagnosis not present

## 2020-10-09 DIAGNOSIS — G9341 Metabolic encephalopathy: Secondary | ICD-10-CM | POA: Diagnosis not present

## 2020-10-09 DIAGNOSIS — I5032 Chronic diastolic (congestive) heart failure: Secondary | ICD-10-CM | POA: Diagnosis not present

## 2020-10-09 DIAGNOSIS — J1282 Pneumonia due to coronavirus disease 2019: Secondary | ICD-10-CM | POA: Diagnosis not present

## 2020-10-09 DIAGNOSIS — I110 Hypertensive heart disease with heart failure: Secondary | ICD-10-CM | POA: Diagnosis not present

## 2020-10-09 DIAGNOSIS — J439 Emphysema, unspecified: Secondary | ICD-10-CM | POA: Diagnosis not present

## 2020-10-09 DIAGNOSIS — N179 Acute kidney failure, unspecified: Secondary | ICD-10-CM | POA: Diagnosis not present

## 2020-10-14 DIAGNOSIS — J449 Chronic obstructive pulmonary disease, unspecified: Secondary | ICD-10-CM | POA: Diagnosis not present

## 2020-10-16 DIAGNOSIS — I350 Nonrheumatic aortic (valve) stenosis: Secondary | ICD-10-CM | POA: Diagnosis not present

## 2020-10-16 DIAGNOSIS — I110 Hypertensive heart disease with heart failure: Secondary | ICD-10-CM | POA: Diagnosis not present

## 2020-10-16 DIAGNOSIS — I5032 Chronic diastolic (congestive) heart failure: Secondary | ICD-10-CM | POA: Diagnosis not present

## 2020-10-16 DIAGNOSIS — J1282 Pneumonia due to coronavirus disease 2019: Secondary | ICD-10-CM | POA: Diagnosis not present

## 2020-10-16 DIAGNOSIS — E119 Type 2 diabetes mellitus without complications: Secondary | ICD-10-CM | POA: Diagnosis not present

## 2020-10-16 DIAGNOSIS — G9341 Metabolic encephalopathy: Secondary | ICD-10-CM | POA: Diagnosis not present

## 2020-10-16 DIAGNOSIS — N179 Acute kidney failure, unspecified: Secondary | ICD-10-CM | POA: Diagnosis not present

## 2020-10-16 DIAGNOSIS — U071 COVID-19: Secondary | ICD-10-CM | POA: Diagnosis not present

## 2020-10-16 DIAGNOSIS — J439 Emphysema, unspecified: Secondary | ICD-10-CM | POA: Diagnosis not present

## 2020-10-16 DIAGNOSIS — R69 Illness, unspecified: Secondary | ICD-10-CM | POA: Diagnosis not present

## 2020-10-18 DIAGNOSIS — E119 Type 2 diabetes mellitus without complications: Secondary | ICD-10-CM | POA: Diagnosis not present

## 2020-10-18 DIAGNOSIS — N179 Acute kidney failure, unspecified: Secondary | ICD-10-CM | POA: Diagnosis not present

## 2020-10-18 DIAGNOSIS — I350 Nonrheumatic aortic (valve) stenosis: Secondary | ICD-10-CM | POA: Diagnosis not present

## 2020-10-18 DIAGNOSIS — J1282 Pneumonia due to coronavirus disease 2019: Secondary | ICD-10-CM | POA: Diagnosis not present

## 2020-10-18 DIAGNOSIS — J439 Emphysema, unspecified: Secondary | ICD-10-CM | POA: Diagnosis not present

## 2020-10-18 DIAGNOSIS — U071 COVID-19: Secondary | ICD-10-CM | POA: Diagnosis not present

## 2020-10-18 DIAGNOSIS — I5032 Chronic diastolic (congestive) heart failure: Secondary | ICD-10-CM | POA: Diagnosis not present

## 2020-10-18 DIAGNOSIS — G9341 Metabolic encephalopathy: Secondary | ICD-10-CM | POA: Diagnosis not present

## 2020-10-18 DIAGNOSIS — I110 Hypertensive heart disease with heart failure: Secondary | ICD-10-CM | POA: Diagnosis not present

## 2020-10-18 DIAGNOSIS — R69 Illness, unspecified: Secondary | ICD-10-CM | POA: Diagnosis not present

## 2020-10-29 DIAGNOSIS — J449 Chronic obstructive pulmonary disease, unspecified: Secondary | ICD-10-CM | POA: Diagnosis not present

## 2020-10-30 DIAGNOSIS — E2839 Other primary ovarian failure: Secondary | ICD-10-CM | POA: Diagnosis not present

## 2020-10-31 ENCOUNTER — Telehealth: Payer: Self-pay

## 2020-10-31 DIAGNOSIS — F330 Major depressive disorder, recurrent, mild: Secondary | ICD-10-CM

## 2020-10-31 DIAGNOSIS — F4310 Post-traumatic stress disorder, unspecified: Secondary | ICD-10-CM

## 2020-10-31 MED ORDER — ARIPIPRAZOLE 15 MG PO TABS: 15.0000 mg | ORAL_TABLET | Freq: Every day | ORAL | 0 refills | Status: DC

## 2020-10-31 MED ORDER — SERTRALINE HCL 100 MG PO TABS: 150.0000 mg | ORAL_TABLET | Freq: Every day | ORAL | 0 refills | Status: DC

## 2020-10-31 NOTE — Telephone Encounter (Signed)
pt needs enough medication to get to her next appt with dr. Vanetta Shawl.

## 2020-10-31 NOTE — Telephone Encounter (Signed)
I have sent Abilify and sertraline to her pharmacy.  Patient does have upcoming appointment scheduled with Dr. Vanetta Shawl.

## 2020-11-14 DIAGNOSIS — J449 Chronic obstructive pulmonary disease, unspecified: Secondary | ICD-10-CM | POA: Diagnosis not present
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
## 2020-11-19 ENCOUNTER — Encounter: Payer: Self-pay | Admitting: Psychiatry

## 2020-11-19 ENCOUNTER — Other Ambulatory Visit: Payer: Self-pay

## 2020-11-19 ENCOUNTER — Telehealth (INDEPENDENT_AMBULATORY_CARE_PROVIDER_SITE_OTHER): Payer: Medicare HMO | Admitting: Psychiatry

## 2020-11-19 DIAGNOSIS — R69 Illness, unspecified: Secondary | ICD-10-CM | POA: Diagnosis not present

## 2020-11-19 DIAGNOSIS — R4189 Other symptoms and signs involving cognitive functions and awareness: Secondary | ICD-10-CM | POA: Diagnosis not present

## 2020-11-19 DIAGNOSIS — F320 Major depressive disorder, single episode, mild: Secondary | ICD-10-CM | POA: Diagnosis not present

## 2020-11-19 DIAGNOSIS — F4310 Post-traumatic stress disorder, unspecified: Secondary | ICD-10-CM | POA: Diagnosis not present

## 2020-11-19 MED ORDER — SERTRALINE HCL 100 MG PO TABS: 150.0000 mg | ORAL_TABLET | Freq: Every day | ORAL | 1 refills | Status: DC

## 2020-11-19 MED ORDER — ARIPIPRAZOLE 15 MG PO TABS: 15.0000 mg | ORAL_TABLET | Freq: Every day | ORAL | 0 refills | Status: DC

## 2020-11-27 DIAGNOSIS — J449 Chronic obstructive pulmonary disease, unspecified: Secondary | ICD-10-CM | POA: Diagnosis not present

## 2020-12-15 DIAGNOSIS — J449 Chronic obstructive pulmonary disease, unspecified: Secondary | ICD-10-CM | POA: Diagnosis not present

## 2020-12-21 DIAGNOSIS — J449 Chronic obstructive pulmonary disease, unspecified: Secondary | ICD-10-CM | POA: Diagnosis not present

## 2020-12-27 DIAGNOSIS — Z20822 Contact with and (suspected) exposure to covid-19: Secondary | ICD-10-CM | POA: Diagnosis not present

## 2020-12-27 DIAGNOSIS — R0981 Nasal congestion: Secondary | ICD-10-CM | POA: Diagnosis not present

## 2020-12-27 DIAGNOSIS — J984 Other disorders of lung: Secondary | ICD-10-CM | POA: Diagnosis not present

## 2020-12-27 DIAGNOSIS — J439 Emphysema, unspecified: Secondary | ICD-10-CM | POA: Diagnosis not present

## 2020-12-27 DIAGNOSIS — I509 Heart failure, unspecified: Secondary | ICD-10-CM | POA: Diagnosis not present

## 2020-12-27 DIAGNOSIS — R069 Unspecified abnormalities of breathing: Secondary | ICD-10-CM | POA: Diagnosis not present

## 2020-12-27 DIAGNOSIS — I700 Atherosclerosis of aorta: Secondary | ICD-10-CM | POA: Diagnosis not present

## 2020-12-27 DIAGNOSIS — R0602 Shortness of breath: Secondary | ICD-10-CM | POA: Diagnosis not present

## 2020-12-27 DIAGNOSIS — N289 Disorder of kidney and ureter, unspecified: Secondary | ICD-10-CM | POA: Diagnosis not present

## 2020-12-27 DIAGNOSIS — Z743 Need for continuous supervision: Secondary | ICD-10-CM | POA: Diagnosis not present

## 2020-12-27 DIAGNOSIS — I959 Hypotension, unspecified: Secondary | ICD-10-CM | POA: Diagnosis not present

## 2020-12-27 DIAGNOSIS — R059 Cough, unspecified: Secondary | ICD-10-CM | POA: Diagnosis not present

## 2020-12-27 DIAGNOSIS — R4182 Altered mental status, unspecified: Secondary | ICD-10-CM | POA: Diagnosis not present

## 2020-12-27 DIAGNOSIS — I110 Hypertensive heart disease with heart failure: Secondary | ICD-10-CM | POA: Diagnosis not present

## 2020-12-27 DIAGNOSIS — N179 Acute kidney failure, unspecified: Secondary | ICD-10-CM | POA: Diagnosis not present

## 2020-12-27 DIAGNOSIS — F319 Bipolar disorder, unspecified: Secondary | ICD-10-CM | POA: Diagnosis not present

## 2020-12-27 DIAGNOSIS — R69 Illness, unspecified: Secondary | ICD-10-CM | POA: Diagnosis not present

## 2020-12-27 DIAGNOSIS — K8020 Calculus of gallbladder without cholecystitis without obstruction: Secondary | ICD-10-CM | POA: Diagnosis not present

## 2020-12-27 DIAGNOSIS — K922 Gastrointestinal hemorrhage, unspecified: Secondary | ICD-10-CM | POA: Diagnosis not present

## 2020-12-27 DIAGNOSIS — J441 Chronic obstructive pulmonary disease with (acute) exacerbation: Secondary | ICD-10-CM | POA: Diagnosis not present

## 2020-12-27 DIAGNOSIS — K821 Hydrops of gallbladder: Secondary | ICD-10-CM | POA: Diagnosis not present

## 2020-12-28 DIAGNOSIS — R69 Illness, unspecified: Secondary | ICD-10-CM | POA: Diagnosis not present

## 2020-12-28 DIAGNOSIS — K922 Gastrointestinal hemorrhage, unspecified: Secondary | ICD-10-CM | POA: Diagnosis not present

## 2020-12-28 DIAGNOSIS — J449 Chronic obstructive pulmonary disease, unspecified: Secondary | ICD-10-CM | POA: Diagnosis not present

## 2020-12-28 DIAGNOSIS — K921 Melena: Secondary | ICD-10-CM | POA: Diagnosis not present

## 2020-12-28 DIAGNOSIS — N179 Acute kidney failure, unspecified: Secondary | ICD-10-CM | POA: Diagnosis not present

## 2020-12-28 DIAGNOSIS — N19 Unspecified kidney failure: Secondary | ICD-10-CM | POA: Diagnosis not present

## 2020-12-28 DIAGNOSIS — R0602 Shortness of breath: Secondary | ICD-10-CM | POA: Diagnosis not present

## 2020-12-28 DIAGNOSIS — K821 Hydrops of gallbladder: Secondary | ICD-10-CM | POA: Diagnosis not present

## 2020-12-28 DIAGNOSIS — D62 Acute posthemorrhagic anemia: Secondary | ICD-10-CM | POA: Diagnosis not present

## 2020-12-28 DIAGNOSIS — N289 Disorder of kidney and ureter, unspecified: Secondary | ICD-10-CM | POA: Diagnosis not present

## 2020-12-28 DIAGNOSIS — I10 Essential (primary) hypertension: Secondary | ICD-10-CM | POA: Diagnosis not present

## 2020-12-28 DIAGNOSIS — E872 Acidosis: Secondary | ICD-10-CM | POA: Diagnosis not present

## 2020-12-28 DIAGNOSIS — J9611 Chronic respiratory failure with hypoxia: Secondary | ICD-10-CM | POA: Diagnosis not present

## 2020-12-28 DIAGNOSIS — G9341 Metabolic encephalopathy: Secondary | ICD-10-CM | POA: Diagnosis not present

## 2020-12-28 DIAGNOSIS — K8020 Calculus of gallbladder without cholecystitis without obstruction: Secondary | ICD-10-CM | POA: Diagnosis not present

## 2020-12-28 DIAGNOSIS — R1310 Dysphagia, unspecified: Secondary | ICD-10-CM | POA: Diagnosis not present

## 2020-12-28 DIAGNOSIS — J189 Pneumonia, unspecified organism: Secondary | ICD-10-CM | POA: Diagnosis not present

## 2020-12-28 DIAGNOSIS — I129 Hypertensive chronic kidney disease with stage 1 through stage 4 chronic kidney disease, or unspecified chronic kidney disease: Secondary | ICD-10-CM | POA: Diagnosis not present

## 2020-12-29 DIAGNOSIS — J9811 Atelectasis: Secondary | ICD-10-CM | POA: Diagnosis not present

## 2020-12-29 DIAGNOSIS — E119 Type 2 diabetes mellitus without complications: Secondary | ICD-10-CM | POA: Diagnosis not present

## 2020-12-29 DIAGNOSIS — N179 Acute kidney failure, unspecified: Secondary | ICD-10-CM | POA: Diagnosis not present

## 2020-12-29 DIAGNOSIS — R4182 Altered mental status, unspecified: Secondary | ICD-10-CM | POA: Diagnosis not present

## 2020-12-29 DIAGNOSIS — K219 Gastro-esophageal reflux disease without esophagitis: Secondary | ICD-10-CM | POA: Diagnosis not present

## 2020-12-29 DIAGNOSIS — R0602 Shortness of breath: Secondary | ICD-10-CM | POA: Diagnosis not present

## 2020-12-29 DIAGNOSIS — D649 Anemia, unspecified: Secondary | ICD-10-CM | POA: Diagnosis not present

## 2020-12-29 DIAGNOSIS — R5383 Other fatigue: Secondary | ICD-10-CM | POA: Diagnosis not present

## 2020-12-29 DIAGNOSIS — R195 Other fecal abnormalities: Secondary | ICD-10-CM | POA: Diagnosis not present

## 2020-12-29 DIAGNOSIS — M810 Age-related osteoporosis without current pathological fracture: Secondary | ICD-10-CM | POA: Diagnosis not present

## 2020-12-29 DIAGNOSIS — I5032 Chronic diastolic (congestive) heart failure: Secondary | ICD-10-CM | POA: Diagnosis not present

## 2020-12-29 DIAGNOSIS — R059 Cough, unspecified: Secondary | ICD-10-CM | POA: Diagnosis not present

## 2020-12-29 DIAGNOSIS — J440 Chronic obstructive pulmonary disease with acute lower respiratory infection: Secondary | ICD-10-CM | POA: Diagnosis not present

## 2020-12-29 DIAGNOSIS — E872 Acidosis, unspecified: Secondary | ICD-10-CM | POA: Diagnosis not present

## 2020-12-29 DIAGNOSIS — J189 Pneumonia, unspecified organism: Secondary | ICD-10-CM | POA: Diagnosis not present

## 2020-12-29 DIAGNOSIS — R630 Anorexia: Secondary | ICD-10-CM | POA: Diagnosis not present

## 2020-12-29 DIAGNOSIS — J9611 Chronic respiratory failure with hypoxia: Secondary | ICD-10-CM | POA: Diagnosis not present

## 2020-12-29 DIAGNOSIS — F419 Anxiety disorder, unspecified: Secondary | ICD-10-CM | POA: Diagnosis not present

## 2020-12-29 DIAGNOSIS — M1990 Unspecified osteoarthritis, unspecified site: Secondary | ICD-10-CM | POA: Diagnosis not present

## 2020-12-29 DIAGNOSIS — K922 Gastrointestinal hemorrhage, unspecified: Secondary | ICD-10-CM | POA: Diagnosis not present

## 2020-12-29 DIAGNOSIS — J449 Chronic obstructive pulmonary disease, unspecified: Secondary | ICD-10-CM | POA: Diagnosis not present

## 2020-12-29 DIAGNOSIS — D62 Acute posthemorrhagic anemia: Secondary | ICD-10-CM | POA: Diagnosis not present

## 2020-12-29 DIAGNOSIS — I110 Hypertensive heart disease with heart failure: Secondary | ICD-10-CM | POA: Diagnosis not present

## 2020-12-29 DIAGNOSIS — J690 Pneumonitis due to inhalation of food and vomit: Secondary | ICD-10-CM | POA: Diagnosis not present

## 2020-12-29 DIAGNOSIS — R1310 Dysphagia, unspecified: Secondary | ICD-10-CM | POA: Diagnosis not present

## 2020-12-29 DIAGNOSIS — F319 Bipolar disorder, unspecified: Secondary | ICD-10-CM | POA: Diagnosis not present

## 2020-12-29 DIAGNOSIS — D509 Iron deficiency anemia, unspecified: Secondary | ICD-10-CM | POA: Diagnosis not present

## 2020-12-29 DIAGNOSIS — G9341 Metabolic encephalopathy: Secondary | ICD-10-CM | POA: Diagnosis not present

## 2020-12-29 DIAGNOSIS — K222 Esophageal obstruction: Secondary | ICD-10-CM | POA: Diagnosis not present

## 2020-12-29 DIAGNOSIS — K921 Melena: Secondary | ICD-10-CM | POA: Diagnosis not present

## 2020-12-29 DIAGNOSIS — E870 Hyperosmolality and hypernatremia: Secondary | ICD-10-CM | POA: Diagnosis not present

## 2020-12-29 DIAGNOSIS — I509 Heart failure, unspecified: Secondary | ICD-10-CM | POA: Diagnosis not present

## 2020-12-29 DIAGNOSIS — E782 Mixed hyperlipidemia: Secondary | ICD-10-CM | POA: Diagnosis not present

## 2020-12-29 DIAGNOSIS — J984 Other disorders of lung: Secondary | ICD-10-CM | POA: Diagnosis not present

## 2020-12-29 DIAGNOSIS — I10 Essential (primary) hypertension: Secondary | ICD-10-CM | POA: Diagnosis not present

## 2020-12-29 DIAGNOSIS — K5521 Angiodysplasia of colon with hemorrhage: Secondary | ICD-10-CM | POA: Diagnosis not present

## 2020-12-29 DIAGNOSIS — R531 Weakness: Secondary | ICD-10-CM | POA: Diagnosis not present

## 2020-12-29 DIAGNOSIS — K449 Diaphragmatic hernia without obstruction or gangrene: Secondary | ICD-10-CM | POA: Diagnosis not present

## 2020-12-31 DIAGNOSIS — K5520 Angiodysplasia of colon without hemorrhage: Secondary | ICD-10-CM | POA: Insufficient documentation

## 2020-12-31 HISTORY — DX: Angiodysplasia of colon without hemorrhage: K55.20

## 2021-01-08 DIAGNOSIS — J9611 Chronic respiratory failure with hypoxia: Secondary | ICD-10-CM | POA: Diagnosis not present

## 2021-01-08 DIAGNOSIS — N179 Acute kidney failure, unspecified: Secondary | ICD-10-CM | POA: Diagnosis not present

## 2021-01-08 DIAGNOSIS — Z299 Encounter for prophylactic measures, unspecified: Secondary | ICD-10-CM | POA: Diagnosis not present

## 2021-01-08 DIAGNOSIS — D62 Acute posthemorrhagic anemia: Secondary | ICD-10-CM | POA: Diagnosis not present

## 2021-01-08 DIAGNOSIS — N390 Urinary tract infection, site not specified: Secondary | ICD-10-CM | POA: Diagnosis not present

## 2021-01-08 DIAGNOSIS — E875 Hyperkalemia: Secondary | ICD-10-CM | POA: Diagnosis not present

## 2021-01-08 DIAGNOSIS — R69 Illness, unspecified: Secondary | ICD-10-CM | POA: Diagnosis not present

## 2021-01-08 DIAGNOSIS — I110 Hypertensive heart disease with heart failure: Secondary | ICD-10-CM | POA: Diagnosis not present

## 2021-01-08 DIAGNOSIS — G9341 Metabolic encephalopathy: Secondary | ICD-10-CM | POA: Diagnosis not present

## 2021-01-08 DIAGNOSIS — J440 Chronic obstructive pulmonary disease with acute lower respiratory infection: Secondary | ICD-10-CM | POA: Diagnosis not present

## 2021-01-08 DIAGNOSIS — K222 Esophageal obstruction: Secondary | ICD-10-CM | POA: Diagnosis not present

## 2021-01-08 DIAGNOSIS — I10 Essential (primary) hypertension: Secondary | ICD-10-CM | POA: Diagnosis not present

## 2021-01-08 DIAGNOSIS — Z23 Encounter for immunization: Secondary | ICD-10-CM | POA: Diagnosis not present

## 2021-01-08 DIAGNOSIS — J189 Pneumonia, unspecified organism: Secondary | ICD-10-CM | POA: Diagnosis not present

## 2021-01-08 DIAGNOSIS — E1165 Type 2 diabetes mellitus with hyperglycemia: Secondary | ICD-10-CM | POA: Diagnosis not present

## 2021-01-08 DIAGNOSIS — K921 Melena: Secondary | ICD-10-CM | POA: Diagnosis not present

## 2021-01-08 DIAGNOSIS — I5032 Chronic diastolic (congestive) heart failure: Secondary | ICD-10-CM | POA: Diagnosis not present

## 2021-01-08 DIAGNOSIS — Z6825 Body mass index (BMI) 25.0-25.9, adult: Secondary | ICD-10-CM | POA: Diagnosis not present

## 2021-01-08 DIAGNOSIS — R350 Frequency of micturition: Secondary | ICD-10-CM | POA: Diagnosis not present

## 2021-01-08 DIAGNOSIS — K922 Gastrointestinal hemorrhage, unspecified: Secondary | ICD-10-CM | POA: Diagnosis not present

## 2021-01-08 DIAGNOSIS — F17210 Nicotine dependence, cigarettes, uncomplicated: Secondary | ICD-10-CM | POA: Diagnosis not present

## 2021-01-09 DIAGNOSIS — J440 Chronic obstructive pulmonary disease with acute lower respiratory infection: Secondary | ICD-10-CM | POA: Diagnosis not present

## 2021-01-09 DIAGNOSIS — D62 Acute posthemorrhagic anemia: Secondary | ICD-10-CM | POA: Diagnosis not present

## 2021-01-09 DIAGNOSIS — I110 Hypertensive heart disease with heart failure: Secondary | ICD-10-CM | POA: Diagnosis not present

## 2021-01-09 DIAGNOSIS — N179 Acute kidney failure, unspecified: Secondary | ICD-10-CM | POA: Diagnosis not present

## 2021-01-09 DIAGNOSIS — J189 Pneumonia, unspecified organism: Secondary | ICD-10-CM | POA: Diagnosis not present

## 2021-01-09 DIAGNOSIS — J9611 Chronic respiratory failure with hypoxia: Secondary | ICD-10-CM | POA: Diagnosis not present

## 2021-01-09 DIAGNOSIS — E875 Hyperkalemia: Secondary | ICD-10-CM | POA: Diagnosis not present

## 2021-01-09 DIAGNOSIS — G9341 Metabolic encephalopathy: Secondary | ICD-10-CM | POA: Diagnosis not present

## 2021-01-09 DIAGNOSIS — K921 Melena: Secondary | ICD-10-CM | POA: Diagnosis not present

## 2021-01-09 DIAGNOSIS — I5032 Chronic diastolic (congestive) heart failure: Secondary | ICD-10-CM | POA: Diagnosis not present

## 2021-01-10 DIAGNOSIS — R69 Illness, unspecified: Secondary | ICD-10-CM | POA: Diagnosis not present

## 2021-01-10 DIAGNOSIS — R030 Elevated blood-pressure reading, without diagnosis of hypertension: Secondary | ICD-10-CM | POA: Diagnosis not present

## 2021-01-10 DIAGNOSIS — E261 Secondary hyperaldosteronism: Secondary | ICD-10-CM | POA: Diagnosis not present

## 2021-01-10 DIAGNOSIS — D649 Anemia, unspecified: Secondary | ICD-10-CM | POA: Diagnosis not present

## 2021-01-10 DIAGNOSIS — I509 Heart failure, unspecified: Secondary | ICD-10-CM | POA: Diagnosis not present

## 2021-01-10 DIAGNOSIS — N390 Urinary tract infection, site not specified: Secondary | ICD-10-CM | POA: Diagnosis not present

## 2021-01-10 DIAGNOSIS — E119 Type 2 diabetes mellitus without complications: Secondary | ICD-10-CM | POA: Diagnosis not present

## 2021-01-10 DIAGNOSIS — Z87891 Personal history of nicotine dependence: Secondary | ICD-10-CM | POA: Diagnosis not present

## 2021-01-10 DIAGNOSIS — Z7984 Long term (current) use of oral hypoglycemic drugs: Secondary | ICD-10-CM | POA: Diagnosis not present

## 2021-01-10 DIAGNOSIS — J9610 Chronic respiratory failure, unspecified whether with hypoxia or hypercapnia: Secondary | ICD-10-CM | POA: Diagnosis not present

## 2021-01-10 DIAGNOSIS — Z8249 Family history of ischemic heart disease and other diseases of the circulatory system: Secondary | ICD-10-CM | POA: Diagnosis not present

## 2021-01-10 DIAGNOSIS — E785 Hyperlipidemia, unspecified: Secondary | ICD-10-CM | POA: Diagnosis not present

## 2021-01-10 DIAGNOSIS — J439 Emphysema, unspecified: Secondary | ICD-10-CM | POA: Diagnosis not present

## 2021-01-14 DIAGNOSIS — J449 Chronic obstructive pulmonary disease, unspecified: Secondary | ICD-10-CM | POA: Diagnosis not present

## 2021-01-15 DIAGNOSIS — J189 Pneumonia, unspecified organism: Secondary | ICD-10-CM | POA: Diagnosis not present

## 2021-01-15 DIAGNOSIS — N179 Acute kidney failure, unspecified: Secondary | ICD-10-CM | POA: Diagnosis not present

## 2021-01-15 DIAGNOSIS — I110 Hypertensive heart disease with heart failure: Secondary | ICD-10-CM | POA: Diagnosis not present

## 2021-01-15 DIAGNOSIS — J9611 Chronic respiratory failure with hypoxia: Secondary | ICD-10-CM | POA: Diagnosis not present

## 2021-01-15 DIAGNOSIS — E875 Hyperkalemia: Secondary | ICD-10-CM | POA: Diagnosis not present

## 2021-01-15 DIAGNOSIS — J440 Chronic obstructive pulmonary disease with acute lower respiratory infection: Secondary | ICD-10-CM | POA: Diagnosis not present

## 2021-01-15 DIAGNOSIS — G9341 Metabolic encephalopathy: Secondary | ICD-10-CM | POA: Diagnosis not present

## 2021-01-15 DIAGNOSIS — K921 Melena: Secondary | ICD-10-CM | POA: Diagnosis not present

## 2021-01-15 DIAGNOSIS — D62 Acute posthemorrhagic anemia: Secondary | ICD-10-CM | POA: Diagnosis not present

## 2021-01-15 DIAGNOSIS — I5032 Chronic diastolic (congestive) heart failure: Secondary | ICD-10-CM | POA: Diagnosis not present

## 2021-01-16 DIAGNOSIS — J189 Pneumonia, unspecified organism: Secondary | ICD-10-CM | POA: Diagnosis not present

## 2021-01-16 DIAGNOSIS — J440 Chronic obstructive pulmonary disease with acute lower respiratory infection: Secondary | ICD-10-CM | POA: Diagnosis not present

## 2021-01-16 DIAGNOSIS — E875 Hyperkalemia: Secondary | ICD-10-CM | POA: Diagnosis not present

## 2021-01-16 DIAGNOSIS — G9341 Metabolic encephalopathy: Secondary | ICD-10-CM | POA: Diagnosis not present

## 2021-01-16 DIAGNOSIS — I5032 Chronic diastolic (congestive) heart failure: Secondary | ICD-10-CM | POA: Diagnosis not present

## 2021-01-16 DIAGNOSIS — J9611 Chronic respiratory failure with hypoxia: Secondary | ICD-10-CM | POA: Diagnosis not present

## 2021-01-16 DIAGNOSIS — D62 Acute posthemorrhagic anemia: Secondary | ICD-10-CM | POA: Diagnosis not present

## 2021-01-16 DIAGNOSIS — K921 Melena: Secondary | ICD-10-CM | POA: Diagnosis not present

## 2021-01-16 DIAGNOSIS — N179 Acute kidney failure, unspecified: Secondary | ICD-10-CM | POA: Diagnosis not present

## 2021-01-16 DIAGNOSIS — I110 Hypertensive heart disease with heart failure: Secondary | ICD-10-CM | POA: Diagnosis not present

## 2021-01-18 DIAGNOSIS — N179 Acute kidney failure, unspecified: Secondary | ICD-10-CM | POA: Diagnosis not present

## 2021-01-18 DIAGNOSIS — I110 Hypertensive heart disease with heart failure: Secondary | ICD-10-CM | POA: Diagnosis not present

## 2021-01-18 DIAGNOSIS — J189 Pneumonia, unspecified organism: Secondary | ICD-10-CM | POA: Diagnosis not present

## 2021-01-18 DIAGNOSIS — K921 Melena: Secondary | ICD-10-CM | POA: Diagnosis not present

## 2021-01-18 DIAGNOSIS — D62 Acute posthemorrhagic anemia: Secondary | ICD-10-CM | POA: Diagnosis not present

## 2021-01-18 DIAGNOSIS — I5032 Chronic diastolic (congestive) heart failure: Secondary | ICD-10-CM | POA: Diagnosis not present

## 2021-01-18 DIAGNOSIS — G9341 Metabolic encephalopathy: Secondary | ICD-10-CM | POA: Diagnosis not present

## 2021-01-18 DIAGNOSIS — J9611 Chronic respiratory failure with hypoxia: Secondary | ICD-10-CM | POA: Diagnosis not present

## 2021-01-18 DIAGNOSIS — E875 Hyperkalemia: Secondary | ICD-10-CM | POA: Diagnosis not present

## 2021-01-18 DIAGNOSIS — J440 Chronic obstructive pulmonary disease with acute lower respiratory infection: Secondary | ICD-10-CM | POA: Diagnosis not present

## 2021-01-21 DIAGNOSIS — K921 Melena: Secondary | ICD-10-CM | POA: Diagnosis not present

## 2021-01-21 DIAGNOSIS — J189 Pneumonia, unspecified organism: Secondary | ICD-10-CM | POA: Diagnosis not present

## 2021-01-21 DIAGNOSIS — Z299 Encounter for prophylactic measures, unspecified: Secondary | ICD-10-CM | POA: Diagnosis not present

## 2021-01-21 DIAGNOSIS — R69 Illness, unspecified: Secondary | ICD-10-CM | POA: Diagnosis not present

## 2021-01-21 DIAGNOSIS — I110 Hypertensive heart disease with heart failure: Secondary | ICD-10-CM | POA: Diagnosis not present

## 2021-01-21 DIAGNOSIS — I25119 Atherosclerotic heart disease of native coronary artery with unspecified angina pectoris: Secondary | ICD-10-CM | POA: Diagnosis not present

## 2021-01-21 DIAGNOSIS — N179 Acute kidney failure, unspecified: Secondary | ICD-10-CM | POA: Diagnosis not present

## 2021-01-21 DIAGNOSIS — I10 Essential (primary) hypertension: Secondary | ICD-10-CM | POA: Diagnosis not present

## 2021-01-21 DIAGNOSIS — R000 Tachycardia, unspecified: Secondary | ICD-10-CM | POA: Diagnosis not present

## 2021-01-21 DIAGNOSIS — J9611 Chronic respiratory failure with hypoxia: Secondary | ICD-10-CM | POA: Diagnosis not present

## 2021-01-21 DIAGNOSIS — E1165 Type 2 diabetes mellitus with hyperglycemia: Secondary | ICD-10-CM | POA: Diagnosis not present

## 2021-01-21 DIAGNOSIS — I5032 Chronic diastolic (congestive) heart failure: Secondary | ICD-10-CM | POA: Diagnosis not present

## 2021-01-21 DIAGNOSIS — E875 Hyperkalemia: Secondary | ICD-10-CM | POA: Diagnosis not present

## 2021-01-21 DIAGNOSIS — G9341 Metabolic encephalopathy: Secondary | ICD-10-CM | POA: Diagnosis not present

## 2021-01-21 DIAGNOSIS — J440 Chronic obstructive pulmonary disease with acute lower respiratory infection: Secondary | ICD-10-CM | POA: Diagnosis not present

## 2021-01-21 DIAGNOSIS — D62 Acute posthemorrhagic anemia: Secondary | ICD-10-CM | POA: Diagnosis not present

## 2021-01-23 DIAGNOSIS — I110 Hypertensive heart disease with heart failure: Secondary | ICD-10-CM | POA: Diagnosis not present

## 2021-01-23 DIAGNOSIS — J440 Chronic obstructive pulmonary disease with acute lower respiratory infection: Secondary | ICD-10-CM | POA: Diagnosis not present

## 2021-01-23 DIAGNOSIS — J9611 Chronic respiratory failure with hypoxia: Secondary | ICD-10-CM | POA: Diagnosis not present

## 2021-01-23 DIAGNOSIS — K921 Melena: Secondary | ICD-10-CM | POA: Diagnosis not present

## 2021-01-23 DIAGNOSIS — E875 Hyperkalemia: Secondary | ICD-10-CM | POA: Diagnosis not present

## 2021-01-23 DIAGNOSIS — I5032 Chronic diastolic (congestive) heart failure: Secondary | ICD-10-CM | POA: Diagnosis not present

## 2021-01-23 DIAGNOSIS — J189 Pneumonia, unspecified organism: Secondary | ICD-10-CM | POA: Diagnosis not present

## 2021-01-23 DIAGNOSIS — N179 Acute kidney failure, unspecified: Secondary | ICD-10-CM | POA: Diagnosis not present

## 2021-01-23 DIAGNOSIS — G9341 Metabolic encephalopathy: Secondary | ICD-10-CM | POA: Diagnosis not present

## 2021-01-23 DIAGNOSIS — D62 Acute posthemorrhagic anemia: Secondary | ICD-10-CM | POA: Diagnosis not present

## 2021-01-25 DIAGNOSIS — E875 Hyperkalemia: Secondary | ICD-10-CM | POA: Diagnosis not present

## 2021-01-25 DIAGNOSIS — N179 Acute kidney failure, unspecified: Secondary | ICD-10-CM | POA: Diagnosis not present

## 2021-01-25 DIAGNOSIS — D62 Acute posthemorrhagic anemia: Secondary | ICD-10-CM | POA: Diagnosis not present

## 2021-01-25 DIAGNOSIS — K921 Melena: Secondary | ICD-10-CM | POA: Diagnosis not present

## 2021-01-28 DIAGNOSIS — I5032 Chronic diastolic (congestive) heart failure: Secondary | ICD-10-CM | POA: Diagnosis not present

## 2021-01-28 DIAGNOSIS — K921 Melena: Secondary | ICD-10-CM | POA: Diagnosis not present

## 2021-01-28 DIAGNOSIS — D62 Acute posthemorrhagic anemia: Secondary | ICD-10-CM | POA: Diagnosis not present

## 2021-01-28 DIAGNOSIS — Z720 Tobacco use: Secondary | ICD-10-CM | POA: Diagnosis not present

## 2021-01-28 DIAGNOSIS — I10 Essential (primary) hypertension: Secondary | ICD-10-CM | POA: Diagnosis not present

## 2021-01-28 DIAGNOSIS — I110 Hypertensive heart disease with heart failure: Secondary | ICD-10-CM | POA: Diagnosis not present

## 2021-01-28 DIAGNOSIS — E875 Hyperkalemia: Secondary | ICD-10-CM | POA: Diagnosis not present

## 2021-01-28 DIAGNOSIS — J9611 Chronic respiratory failure with hypoxia: Secondary | ICD-10-CM | POA: Diagnosis not present

## 2021-01-28 DIAGNOSIS — J440 Chronic obstructive pulmonary disease with acute lower respiratory infection: Secondary | ICD-10-CM | POA: Diagnosis not present

## 2021-01-28 DIAGNOSIS — J449 Chronic obstructive pulmonary disease, unspecified: Secondary | ICD-10-CM | POA: Diagnosis not present

## 2021-01-28 DIAGNOSIS — J189 Pneumonia, unspecified organism: Secondary | ICD-10-CM | POA: Diagnosis not present

## 2021-01-28 DIAGNOSIS — N179 Acute kidney failure, unspecified: Secondary | ICD-10-CM | POA: Diagnosis not present

## 2021-01-28 DIAGNOSIS — G9341 Metabolic encephalopathy: Secondary | ICD-10-CM | POA: Diagnosis not present

## 2021-01-29 DIAGNOSIS — J441 Chronic obstructive pulmonary disease with (acute) exacerbation: Secondary | ICD-10-CM | POA: Diagnosis not present

## 2021-01-29 DIAGNOSIS — F17210 Nicotine dependence, cigarettes, uncomplicated: Secondary | ICD-10-CM | POA: Diagnosis not present

## 2021-01-29 DIAGNOSIS — Z6825 Body mass index (BMI) 25.0-25.9, adult: Secondary | ICD-10-CM | POA: Diagnosis not present

## 2021-01-29 DIAGNOSIS — I10 Essential (primary) hypertension: Secondary | ICD-10-CM | POA: Diagnosis not present

## 2021-01-29 DIAGNOSIS — Z299 Encounter for prophylactic measures, unspecified: Secondary | ICD-10-CM | POA: Diagnosis not present

## 2021-01-29 DIAGNOSIS — R69 Illness, unspecified: Secondary | ICD-10-CM | POA: Diagnosis not present

## 2021-01-29 DIAGNOSIS — R000 Tachycardia, unspecified: Secondary | ICD-10-CM | POA: Diagnosis not present

## 2021-02-05 DIAGNOSIS — D62 Acute posthemorrhagic anemia: Secondary | ICD-10-CM | POA: Diagnosis not present

## 2021-02-05 DIAGNOSIS — J9611 Chronic respiratory failure with hypoxia: Secondary | ICD-10-CM | POA: Diagnosis not present

## 2021-02-05 DIAGNOSIS — J440 Chronic obstructive pulmonary disease with acute lower respiratory infection: Secondary | ICD-10-CM | POA: Diagnosis not present

## 2021-02-05 DIAGNOSIS — G9341 Metabolic encephalopathy: Secondary | ICD-10-CM | POA: Diagnosis not present

## 2021-02-05 DIAGNOSIS — E875 Hyperkalemia: Secondary | ICD-10-CM | POA: Diagnosis not present

## 2021-02-05 DIAGNOSIS — K921 Melena: Secondary | ICD-10-CM | POA: Diagnosis not present

## 2021-02-05 DIAGNOSIS — J189 Pneumonia, unspecified organism: Secondary | ICD-10-CM | POA: Diagnosis not present

## 2021-02-05 DIAGNOSIS — I5032 Chronic diastolic (congestive) heart failure: Secondary | ICD-10-CM | POA: Diagnosis not present

## 2021-02-05 DIAGNOSIS — N179 Acute kidney failure, unspecified: Secondary | ICD-10-CM | POA: Diagnosis not present

## 2021-02-05 DIAGNOSIS — I110 Hypertensive heart disease with heart failure: Secondary | ICD-10-CM | POA: Diagnosis not present

## 2021-02-08 DIAGNOSIS — K921 Melena: Secondary | ICD-10-CM | POA: Diagnosis not present

## 2021-02-08 DIAGNOSIS — N179 Acute kidney failure, unspecified: Secondary | ICD-10-CM | POA: Diagnosis not present

## 2021-02-08 DIAGNOSIS — D62 Acute posthemorrhagic anemia: Secondary | ICD-10-CM | POA: Diagnosis not present

## 2021-02-08 DIAGNOSIS — E875 Hyperkalemia: Secondary | ICD-10-CM | POA: Diagnosis not present

## 2021-02-11 DIAGNOSIS — K921 Melena: Secondary | ICD-10-CM | POA: Diagnosis not present

## 2021-02-11 DIAGNOSIS — E109 Type 1 diabetes mellitus without complications: Secondary | ICD-10-CM | POA: Diagnosis not present

## 2021-02-11 DIAGNOSIS — Z0100 Encounter for examination of eyes and vision without abnormal findings: Secondary | ICD-10-CM | POA: Diagnosis not present

## 2021-02-11 DIAGNOSIS — I110 Hypertensive heart disease with heart failure: Secondary | ICD-10-CM | POA: Diagnosis not present

## 2021-02-11 DIAGNOSIS — J9611 Chronic respiratory failure with hypoxia: Secondary | ICD-10-CM | POA: Diagnosis not present

## 2021-02-11 DIAGNOSIS — J440 Chronic obstructive pulmonary disease with acute lower respiratory infection: Secondary | ICD-10-CM | POA: Diagnosis not present

## 2021-02-11 DIAGNOSIS — I10 Essential (primary) hypertension: Secondary | ICD-10-CM | POA: Diagnosis not present

## 2021-02-11 DIAGNOSIS — J189 Pneumonia, unspecified organism: Secondary | ICD-10-CM | POA: Diagnosis not present

## 2021-02-11 DIAGNOSIS — E7800 Pure hypercholesterolemia, unspecified: Secondary | ICD-10-CM | POA: Diagnosis not present

## 2021-02-11 DIAGNOSIS — N179 Acute kidney failure, unspecified: Secondary | ICD-10-CM | POA: Diagnosis not present

## 2021-02-11 DIAGNOSIS — H353190 Nonexudative age-related macular degeneration, unspecified eye, stage unspecified: Secondary | ICD-10-CM | POA: Diagnosis not present

## 2021-02-11 DIAGNOSIS — G9341 Metabolic encephalopathy: Secondary | ICD-10-CM | POA: Diagnosis not present

## 2021-02-11 DIAGNOSIS — I5032 Chronic diastolic (congestive) heart failure: Secondary | ICD-10-CM | POA: Diagnosis not present

## 2021-02-11 DIAGNOSIS — E875 Hyperkalemia: Secondary | ICD-10-CM | POA: Diagnosis not present

## 2021-02-11 DIAGNOSIS — D62 Acute posthemorrhagic anemia: Secondary | ICD-10-CM | POA: Diagnosis not present

## 2021-02-12 DIAGNOSIS — I5032 Chronic diastolic (congestive) heart failure: Secondary | ICD-10-CM | POA: Diagnosis not present

## 2021-02-12 DIAGNOSIS — K921 Melena: Secondary | ICD-10-CM | POA: Diagnosis not present

## 2021-02-12 DIAGNOSIS — J189 Pneumonia, unspecified organism: Secondary | ICD-10-CM | POA: Diagnosis not present

## 2021-02-12 DIAGNOSIS — D62 Acute posthemorrhagic anemia: Secondary | ICD-10-CM | POA: Diagnosis not present

## 2021-02-12 DIAGNOSIS — E875 Hyperkalemia: Secondary | ICD-10-CM | POA: Diagnosis not present

## 2021-02-12 DIAGNOSIS — I110 Hypertensive heart disease with heart failure: Secondary | ICD-10-CM | POA: Diagnosis not present

## 2021-02-12 DIAGNOSIS — J440 Chronic obstructive pulmonary disease with acute lower respiratory infection: Secondary | ICD-10-CM | POA: Diagnosis not present

## 2021-02-12 DIAGNOSIS — N179 Acute kidney failure, unspecified: Secondary | ICD-10-CM | POA: Diagnosis not present

## 2021-02-12 DIAGNOSIS — J9611 Chronic respiratory failure with hypoxia: Secondary | ICD-10-CM | POA: Diagnosis not present

## 2021-02-12 DIAGNOSIS — G9341 Metabolic encephalopathy: Secondary | ICD-10-CM | POA: Diagnosis not present

## 2021-02-14 DIAGNOSIS — J449 Chronic obstructive pulmonary disease, unspecified: Secondary | ICD-10-CM | POA: Diagnosis not present
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
## 2021-02-18 ENCOUNTER — Telehealth (INDEPENDENT_AMBULATORY_CARE_PROVIDER_SITE_OTHER): Payer: Medicare HMO | Admitting: Psychiatry

## 2021-02-18 ENCOUNTER — Encounter: Payer: Self-pay | Admitting: Psychiatry

## 2021-02-18 ENCOUNTER — Other Ambulatory Visit: Payer: Self-pay

## 2021-02-18 ENCOUNTER — Encounter: Payer: Self-pay | Admitting: Psychology

## 2021-02-18 DIAGNOSIS — F320 Major depressive disorder, single episode, mild: Secondary | ICD-10-CM

## 2021-02-18 DIAGNOSIS — J449 Chronic obstructive pulmonary disease, unspecified: Secondary | ICD-10-CM | POA: Diagnosis not present

## 2021-02-18 DIAGNOSIS — R4189 Other symptoms and signs involving cognitive functions and awareness: Secondary | ICD-10-CM | POA: Diagnosis not present

## 2021-02-18 DIAGNOSIS — F4310 Post-traumatic stress disorder, unspecified: Secondary | ICD-10-CM

## 2021-02-18 DIAGNOSIS — R69 Illness, unspecified: Secondary | ICD-10-CM | POA: Diagnosis not present

## 2021-02-18 MED ORDER — ARIPIPRAZOLE 15 MG PO TABS: 15.0000 mg | ORAL_TABLET | Freq: Every day | ORAL | 0 refills | Status: DC

## 2021-02-20 DIAGNOSIS — K921 Melena: Secondary | ICD-10-CM | POA: Diagnosis not present

## 2021-02-20 DIAGNOSIS — G9341 Metabolic encephalopathy: Secondary | ICD-10-CM | POA: Diagnosis not present

## 2021-02-20 DIAGNOSIS — N179 Acute kidney failure, unspecified: Secondary | ICD-10-CM | POA: Diagnosis not present

## 2021-02-20 DIAGNOSIS — J189 Pneumonia, unspecified organism: Secondary | ICD-10-CM | POA: Diagnosis not present

## 2021-02-20 DIAGNOSIS — D62 Acute posthemorrhagic anemia: Secondary | ICD-10-CM | POA: Diagnosis not present

## 2021-02-20 DIAGNOSIS — E875 Hyperkalemia: Secondary | ICD-10-CM | POA: Diagnosis not present

## 2021-02-20 DIAGNOSIS — J9611 Chronic respiratory failure with hypoxia: Secondary | ICD-10-CM | POA: Diagnosis not present

## 2021-02-20 DIAGNOSIS — I5032 Chronic diastolic (congestive) heart failure: Secondary | ICD-10-CM | POA: Diagnosis not present

## 2021-02-20 DIAGNOSIS — I110 Hypertensive heart disease with heart failure: Secondary | ICD-10-CM | POA: Diagnosis not present

## 2021-02-20 DIAGNOSIS — J440 Chronic obstructive pulmonary disease with acute lower respiratory infection: Secondary | ICD-10-CM | POA: Diagnosis not present

## 2021-03-14 DIAGNOSIS — J449 Chronic obstructive pulmonary disease, unspecified: Secondary | ICD-10-CM | POA: Diagnosis not present

## 2021-03-16 DIAGNOSIS — J449 Chronic obstructive pulmonary disease, unspecified: Secondary | ICD-10-CM | POA: Diagnosis not present

## 2021-03-29 DIAGNOSIS — Z720 Tobacco use: Secondary | ICD-10-CM | POA: Diagnosis not present

## 2021-03-29 DIAGNOSIS — J449 Chronic obstructive pulmonary disease, unspecified: Secondary | ICD-10-CM | POA: Diagnosis not present

## 2021-04-16 DIAGNOSIS — J449 Chronic obstructive pulmonary disease, unspecified: Secondary | ICD-10-CM | POA: Diagnosis not present

## 2021-04-18 DIAGNOSIS — J449 Chronic obstructive pulmonary disease, unspecified: Secondary | ICD-10-CM | POA: Diagnosis not present

## 2021-04-22 DIAGNOSIS — E1151 Type 2 diabetes mellitus with diabetic peripheral angiopathy without gangrene: Secondary | ICD-10-CM | POA: Diagnosis not present

## 2021-04-22 DIAGNOSIS — Z6828 Body mass index (BMI) 28.0-28.9, adult: Secondary | ICD-10-CM | POA: Diagnosis not present

## 2021-04-22 DIAGNOSIS — E1165 Type 2 diabetes mellitus with hyperglycemia: Secondary | ICD-10-CM | POA: Diagnosis not present

## 2021-04-22 DIAGNOSIS — F17210 Nicotine dependence, cigarettes, uncomplicated: Secondary | ICD-10-CM | POA: Diagnosis not present

## 2021-04-22 DIAGNOSIS — R69 Illness, unspecified: Secondary | ICD-10-CM | POA: Diagnosis not present

## 2021-04-22 DIAGNOSIS — I10 Essential (primary) hypertension: Secondary | ICD-10-CM | POA: Diagnosis not present

## 2021-04-22 DIAGNOSIS — J9611 Chronic respiratory failure with hypoxia: Secondary | ICD-10-CM | POA: Diagnosis not present

## 2021-04-22 DIAGNOSIS — Z299 Encounter for prophylactic measures, unspecified: Secondary | ICD-10-CM | POA: Diagnosis not present

## 2021-04-30 ENCOUNTER — Telehealth: Payer: Self-pay

## 2021-04-30 NOTE — Telephone Encounter (Signed)
pharmacy called states that the pt aripiprazole is due and if they wait until the 26th patient will be without for awhile.  Can you change the ok to fill date.    ARIPiprazole (ABILIFY) 15 MG tablet 90 tablet 0 05/02/2021 07/31/2021   Sig - Route: Take 1 tablet (15 mg total) by mouth daily. - Oral   Sent to pharmacy as: ARIPiprazole (ABILIFY) 15 MG tablet   Notes to Pharmacy: Fill after 2/26

## 2021-04-30 NOTE — Telephone Encounter (Signed)
I believe they have been filling Abilify sooner that it has been prescribed. Could you ask them to look back refills for the past several months (not only the previous refill); she should have enough to last if she takes it correctly.

## 2021-05-02 NOTE — Telephone Encounter (Signed)
Discussed with the pharmacy. Medication will be filled.

## 2021-05-02 NOTE — Telephone Encounter (Signed)
according to them she doesn't have.  the pharmacy wants to speak with you.  They don't see what you are talking about.
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
## 2021-05-16 ENCOUNTER — Other Ambulatory Visit: Payer: Self-pay

## 2021-05-16 ENCOUNTER — Telehealth (INDEPENDENT_AMBULATORY_CARE_PROVIDER_SITE_OTHER): Payer: Medicare HMO | Admitting: Psychiatry

## 2021-05-16 ENCOUNTER — Encounter: Payer: Self-pay | Admitting: Psychiatry

## 2021-05-16 DIAGNOSIS — R69 Illness, unspecified: Secondary | ICD-10-CM | POA: Diagnosis not present

## 2021-05-16 DIAGNOSIS — F320 Major depressive disorder, single episode, mild: Secondary | ICD-10-CM

## 2021-05-16 DIAGNOSIS — J449 Chronic obstructive pulmonary disease, unspecified: Secondary | ICD-10-CM | POA: Diagnosis not present

## 2021-05-16 DIAGNOSIS — F4310 Post-traumatic stress disorder, unspecified: Secondary | ICD-10-CM

## 2021-05-16 DIAGNOSIS — R4189 Other symptoms and signs involving cognitive functions and awareness: Secondary | ICD-10-CM | POA: Diagnosis not present

## 2021-05-17 DIAGNOSIS — J449 Chronic obstructive pulmonary disease, unspecified: Secondary | ICD-10-CM | POA: Diagnosis not present

## 2021-05-28 DIAGNOSIS — E1165 Type 2 diabetes mellitus with hyperglycemia: Secondary | ICD-10-CM | POA: Diagnosis not present

## 2021-05-28 DIAGNOSIS — F17210 Nicotine dependence, cigarettes, uncomplicated: Secondary | ICD-10-CM | POA: Diagnosis not present

## 2021-05-28 DIAGNOSIS — I10 Essential (primary) hypertension: Secondary | ICD-10-CM | POA: Diagnosis not present

## 2021-05-28 DIAGNOSIS — R69 Illness, unspecified: Secondary | ICD-10-CM | POA: Diagnosis not present

## 2021-05-28 DIAGNOSIS — J9611 Chronic respiratory failure with hypoxia: Secondary | ICD-10-CM | POA: Diagnosis not present

## 2021-05-28 DIAGNOSIS — K219 Gastro-esophageal reflux disease without esophagitis: Secondary | ICD-10-CM | POA: Diagnosis not present

## 2021-05-28 DIAGNOSIS — Z6829 Body mass index (BMI) 29.0-29.9, adult: Secondary | ICD-10-CM | POA: Diagnosis not present

## 2021-05-28 DIAGNOSIS — Z299 Encounter for prophylactic measures, unspecified: Secondary | ICD-10-CM | POA: Diagnosis not present

## 2021-06-06 DIAGNOSIS — K2970 Gastritis, unspecified, without bleeding: Secondary | ICD-10-CM | POA: Diagnosis not present

## 2021-06-06 DIAGNOSIS — J449 Chronic obstructive pulmonary disease, unspecified: Secondary | ICD-10-CM | POA: Diagnosis not present

## 2021-06-06 DIAGNOSIS — F17210 Nicotine dependence, cigarettes, uncomplicated: Secondary | ICD-10-CM | POA: Diagnosis not present

## 2021-06-06 DIAGNOSIS — Z299 Encounter for prophylactic measures, unspecified: Secondary | ICD-10-CM | POA: Diagnosis not present

## 2021-06-06 DIAGNOSIS — K219 Gastro-esophageal reflux disease without esophagitis: Secondary | ICD-10-CM | POA: Diagnosis not present

## 2021-06-06 DIAGNOSIS — R69 Illness, unspecified: Secondary | ICD-10-CM | POA: Diagnosis not present

## 2021-06-06 DIAGNOSIS — E1165 Type 2 diabetes mellitus with hyperglycemia: Secondary | ICD-10-CM | POA: Diagnosis not present
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
## 2021-06-10 ENCOUNTER — Ambulatory Visit: Payer: Medicare HMO | Admitting: Psychiatry

## 2021-06-13 DIAGNOSIS — Z79899 Other long term (current) drug therapy: Secondary | ICD-10-CM | POA: Diagnosis not present

## 2021-06-13 DIAGNOSIS — Z1339 Encounter for screening examination for other mental health and behavioral disorders: Secondary | ICD-10-CM | POA: Diagnosis not present

## 2021-06-13 DIAGNOSIS — R69 Illness, unspecified: Secondary | ICD-10-CM | POA: Diagnosis not present

## 2021-06-13 DIAGNOSIS — E7800 Pure hypercholesterolemia, unspecified: Secondary | ICD-10-CM | POA: Diagnosis not present

## 2021-06-13 DIAGNOSIS — Z1331 Encounter for screening for depression: Secondary | ICD-10-CM | POA: Diagnosis not present

## 2021-06-13 DIAGNOSIS — Z0000 Encounter for general adult medical examination without abnormal findings: Secondary | ICD-10-CM | POA: Diagnosis not present

## 2021-06-13 DIAGNOSIS — I10 Essential (primary) hypertension: Secondary | ICD-10-CM | POA: Diagnosis not present

## 2021-06-13 DIAGNOSIS — R5383 Other fatigue: Secondary | ICD-10-CM | POA: Diagnosis not present

## 2021-06-13 DIAGNOSIS — Z6827 Body mass index (BMI) 27.0-27.9, adult: Secondary | ICD-10-CM | POA: Diagnosis not present

## 2021-06-13 DIAGNOSIS — F17210 Nicotine dependence, cigarettes, uncomplicated: Secondary | ICD-10-CM | POA: Diagnosis not present

## 2021-06-13 DIAGNOSIS — Z7189 Other specified counseling: Secondary | ICD-10-CM | POA: Diagnosis not present

## 2021-06-13 DIAGNOSIS — Z299 Encounter for prophylactic measures, unspecified: Secondary | ICD-10-CM | POA: Diagnosis not present

## 2021-06-14 DIAGNOSIS — J449 Chronic obstructive pulmonary disease, unspecified: Secondary | ICD-10-CM | POA: Diagnosis not present

## 2021-06-20 DIAGNOSIS — I10 Essential (primary) hypertension: Secondary | ICD-10-CM | POA: Diagnosis not present

## 2021-06-20 DIAGNOSIS — D509 Iron deficiency anemia, unspecified: Secondary | ICD-10-CM | POA: Diagnosis not present

## 2021-06-20 DIAGNOSIS — R42 Dizziness and giddiness: Secondary | ICD-10-CM | POA: Diagnosis not present

## 2021-06-20 DIAGNOSIS — F17210 Nicotine dependence, cigarettes, uncomplicated: Secondary | ICD-10-CM | POA: Diagnosis not present

## 2021-06-20 DIAGNOSIS — Z299 Encounter for prophylactic measures, unspecified: Secondary | ICD-10-CM | POA: Diagnosis not present

## 2021-06-20 DIAGNOSIS — R69 Illness, unspecified: Secondary | ICD-10-CM | POA: Diagnosis not present

## 2021-06-24 ENCOUNTER — Other Ambulatory Visit: Payer: Self-pay | Admitting: Psychiatry

## 2021-06-24 DIAGNOSIS — F4310 Post-traumatic stress disorder, unspecified: Secondary | ICD-10-CM

## 2021-06-25 ENCOUNTER — Telehealth: Payer: Self-pay

## 2021-06-25 NOTE — Telephone Encounter (Signed)
Please verify with the pharmacy. She should have enough until May.

## 2021-06-25 NOTE — Telephone Encounter (Signed)
pt left a message that she needed a refill of sertraline ?

## 2021-06-26 ENCOUNTER — Telehealth: Payer: Self-pay

## 2021-06-26 NOTE — Telephone Encounter (Signed)
Talked with the pharmacy given there is an order/refill to be last until May. The pharmacy states that she would contact us back if there is still any problem.

## 2021-06-26 NOTE — Progress Notes (Deleted)
BH MD/PA/NP OP Progress Note ? ?06/26/2021 4:44 PM ?Katherine Romero  ?MRN:  [id number] ? ?Chief Complaint: No chief complaint on file. ? ?HPI: *** ?Visit Diagnosis: No diagnosis found. ? ?Past Psychiatric History: Please see initial evaluation for full details. I have reviewed the history. No updates at this time.  ?  ? ?Past Medical History:  ?Past Medical History:  ?Diagnosis Date  ? Anemia   ? Arthritis   ? CHF (congestive heart failure) (HCC)   ? COPD (chronic obstructive pulmonary disease) (HCC)   ? Depression   ? Diabetes mellitus without complication (HCC)   ? GERD (gastroesophageal reflux disease)   ? Hypercholesteremia   ? Hypertension   ?  ?Past Surgical History:  ?Procedure Laterality Date  ? CARPAL TUNNEL RELEASE Left   ? CATARACT EXTRACTION W/PHACO Left 06/08/2017  ? Procedure: CATARACT EXTRACTION PHACO AND INTRAOCULAR LENS PLACEMENT (IOC);  Surgeon: Gemma Payor, MD;  Location: [facility];  Service: Ophthalmology;  Laterality: Left;  CDE: 5.62  ? CATARACT EXTRACTION W/PHACO Right 06/22/2017  ? Procedure: CATARACT EXTRACTION WITH PHACOEMULSIFICATION  AND INTRAOCULAR LENS PLACEMENT RIGHT EYE;  Surgeon: Gemma Payor, MD;  Location: [facility];  Service: Ophthalmology;  Laterality: Right;  CDE: 5.62  ? KNEE SURGERY Right   ? ULNAR NERVE TRANSPOSITION Left   ? WISDOM TOOTH EXTRACTION    ? ? ?Family Psychiatric History: Please see initial evaluation for full details. I have reviewed the history. No updates at this time.  ?  ? ?Family History:  ?Family History  ?Problem Relation Age of Onset  ? Anemia Son   ? Depression Mother   ? Suicidality Cousin   ? ? ?Social History:  ?Social History  ? ?Socioeconomic History  ? Marital status: Married  ?  Spouse name: Not on file  ? Number of children: Not on file  ? Years of education: Not on file  ? Highest education level: Not on file  ?Occupational History  ? Not on file  ?Tobacco Use  ? Smoking status: Every Day  ?  Packs/day: 2.00  ?  Years: 50.00  ?  Pack years: 100.00  ?   Types: Cigarettes  ? Smokeless tobacco: Never  ?Vaping Use  ? Vaping Use: Never used  ?Substance and Sexual Activity  ? Alcohol use: No  ? Drug use: No  ? Sexual activity: Never  ?  Birth control/protection: Post-menopausal  ?Other Topics Concern  ? Not on file  ?Social History Narrative  ? Not on file  ? ?Social Determinants of Health  ? ?Financial Resource Strain: Not on file  ?Food Insecurity: Not on file  ?Transportation Needs: Not on file  ?Physical Activity: Not on file  ?Stress: Not on file  ?Social Connections: Not on file  ? ? ?Allergies: No Known Allergies ? ?Metabolic Disorder Labs: ?Lab Results  ?Component Value Date  ? HGBA1C 6.8 (H) 06/02/2017  ? MPG 148.46 06/02/2017  ? MPG 134 07/19/2015  ? ?No results found for: PROLACTIN ?No results found for: CHOL, TRIG, HDL, CHOLHDL, VLDL, LDLCALC ?Lab Results  ?Component Value Date  ? TSH 3.983 09/26/2015  ? ? ?Therapeutic Level Labs: ?No results found for: LITHIUM ?No results found for: VALPROATE ?No components found for:  CBMZ ? ?Current Medications: ?Current Outpatient Medications  ?Medication Sig Dispense Refill  ? albuterol (PROVENTIL) (2.5 MG/3ML) 0.083% nebulizer solution INHALE ONE VIAL IN NEBULIZER EVERY 6 HOURS AS NEEDED FOR WHEEZING-SHORTNESS OF BREATH  3  ? Albuterol Sulfate (PROAIR RESPICLICK) 108 (90  Base) MCG/ACT AEPB Inhale 2 puffs into the lungs every 6 (six) hours as needed (for wheezing/shortness of breath).     ? amLODipine (NORVASC) 10 MG tablet Take 1 tablet by mouth daily.    ? ARIPiprazole (ABILIFY) 15 MG tablet Take 1 tablet (15 mg total) by mouth daily. 90 tablet 0  ? aspirin 81 MG EC tablet Take by mouth.    ? Aspirin-Salicylamide-Caffeine (BC HEADACHE POWDER PO) Take 1 Package by mouth every 4 (four) hours as needed (PAIN).    ? atorvastatin (LIPITOR) 40 MG tablet Take 40 mg by mouth daily.    ? budesonide-formoterol (SYMBICORT) 160-4.5 MCG/ACT inhaler Inhale 2 puffs into the lungs 2 (two) times daily.    ? ENSURE PLUS (ENSURE PLUS)  LIQD Take 237 mLs by mouth 2 (two) times daily between meals.    ? FEROSUL 325 (65 Fe) MG tablet Take 325 mg by mouth daily.    ? furosemide (LASIX) 20 MG tablet Take 20 mg by mouth 2 (two) times daily.    ? gemfibrozil (LOPID) 600 MG tablet Take 600 mg by mouth 2 (two) times daily.    ? hydrochlorothiazide (HYDRODIURIL) 25 MG tablet Take 1 tablet by mouth daily.    ? JARDIANCE 10 MG TABS tablet Take 10 mg by mouth daily.    ? metFORMIN (GLUCOPHAGE) 500 MG tablet Take 1 tablet by mouth daily.    ? metoprolol (LOPRESSOR) 100 MG tablet Take 100 mg by mouth 2 (two) times daily.    ? metoprolol succinate (TOPROL-XL) 25 MG 24 hr tablet Take 25 mg by mouth 2 (two) times daily.    ? sertraline (ZOLOFT) 100 MG tablet Take 1.5 tablets (150 mg total) by mouth daily. 135 tablet 1  ? ?No current facility-administered medications for this visit.  ? ? ? ?Musculoskeletal: ?Strength & Muscle Tone: within normal limits ?Gait & Station: normal ?Patient leans: N/A ? ?Psychiatric Specialty Exam: ?Review of Systems  ?There were no vitals taken for this visit.There is no height or weight on file to calculate BMI.  ?General Appearance: {Appearance:22683}  ?Eye Contact:  {BHH EYE CONTACT:22684}  ?Speech:  Clear and Coherent  ?Volume:  Normal  ?Mood:  {BHH MOOD:22306}  ?Affect:  {Affect (PAA):22687}  ?Thought Process:  Coherent  ?Orientation:  Full (Time, Place, and Person)  ?Thought Content: Logical   ?Suicidal Thoughts:  {ST/HT (PAA):22692}  ?Homicidal Thoughts:  {ST/HT (PAA):22692}  ?Memory:  Immediate;   Good  ?Judgement:  {Judgement (PAA):22694}  ?Insight:  {Insight (PAA):22695}  ?Psychomotor Activity:  Normal  ?Concentration:  Concentration: Good and Attention Span: Good  ?Recall:  Good  ?Fund of Knowledge: Good  ?Language: Good  ?Akathisia:  No  ?Handed:  Right  ?AIMS (if indicated): not done  ?Assets:  Communication Skills ?Desire for Improvement  ?ADL's:  Intact  ?Cognition: {chl bhh cognition:304700322}  ?Sleep:  {BHH  GOOD/FAIR/POOR:22877}  ? ?Screenings: ?PHQ2-9   ? ?Flowsheet Row Video Visit from 11/19/2020 in [facility] Office Visit from 08/13/2020 in [facility] Video Visit from 06/22/2020 in [facility]  ?PHQ-2 Total Score 2 3 4   ?PHQ-9 Total Score 9 13 7   ? ?  ? ?Flowsheet Row Office Visit from 08/13/2020 in [facility] Video Visit from 06/22/2020 in [facility]  ?C-SSRS RISK CATEGORY No Risk No Risk  ? ?  ? ? ? ?Assessment and Plan:  ?Katherine Romero is a 71 y.o. year old female with a  history of depression, PTSD, COPD, type II diabetes, hypercholesterolemia,  hypertension, iron deficiency anemia, GERD, who presents for follow up appointment for below.  ? ?  ?  ?1. PTSD (post-traumatic stress disorder) ?2. Current mild episode of major depressive disorder without prior episode (HCC) ?She reports slight worsening in depressive symptoms, and started to have panic attacks since the last visit without significant triggers.   Recent psychosocial stressors includes admission due to COVID/pneumonia. Other psychosocial stressors includes limited interaction with her husband at home, and chronic leg pain.  Will continue current dose of sertraline and Abilify to target depression.  She was advised to contact her PCP to rule out any medical condition contributing to her mood symptoms.  She was also advised to obtain labs.  ?  ?3. Cognitive deficits ?Unchanged. She has cognitive deficits, which was evident on mini cog.  She also has an episodes of occasional paranoia, and hallucinations, which have been relatively manageable with Abilify.  Differential includes neurocognitive disorder. Noted that she does not have any other psychotic symptoms to be concerned of underlying psychotic disorder.  She is willing to have neuropsych evaluation; she has an upcoming appointment.  ?  ?   ?Plan ?Continue  sertraline 150 mg daily ?Continue Abilify 15 mg once a day (EKG QTc 393 msec 09/2017) ?Obtain blood test- TSH, vitamin B 12   ?Next appointment: 3/13 at 1:30 for 30 ins, in person ?  ?The patient demonstrates the foll

## 2021-06-26 NOTE — Telephone Encounter (Signed)
Pharmacist from [facility] called regarding pt's Sertraline 100mg . She stated that they're going off a script that was filled in November and they don't have a recent one. She also stated that they bubble pack this pt's medications and it's due to go out today. Pt has a followup appointment scheduled for 4/3. Could you send this in or would you like me to? Please review and advise. Thank you ?

## 2021-06-27 DIAGNOSIS — E785 Hyperlipidemia, unspecified: Secondary | ICD-10-CM | POA: Diagnosis not present

## 2021-06-27 DIAGNOSIS — I10 Essential (primary) hypertension: Secondary | ICD-10-CM | POA: Diagnosis not present

## 2021-06-28 NOTE — Telephone Encounter (Signed)
I have call twice plus called again today left a message again with information for her to check with her pharmacy  because she should have enough until may.   ? ?I am going to close message no return call from patient.  ?

## 2021-07-01 ENCOUNTER — Ambulatory Visit: Payer: Medicare HMO | Admitting: Psychiatry

## 2021-07-15 DIAGNOSIS — J449 Chronic obstructive pulmonary disease, unspecified: Secondary | ICD-10-CM | POA: Diagnosis not present

## 2021-07-17 DIAGNOSIS — R222 Localized swelling, mass and lump, trunk: Secondary | ICD-10-CM | POA: Diagnosis not present

## 2021-07-17 DIAGNOSIS — R928 Other abnormal and inconclusive findings on diagnostic imaging of breast: Secondary | ICD-10-CM | POA: Diagnosis not present

## 2021-07-17 DIAGNOSIS — R920 Mammographic microcalcification found on diagnostic imaging of breast: Secondary | ICD-10-CM | POA: Diagnosis not present

## 2021-07-17 DIAGNOSIS — N6489 Other specified disorders of breast: Secondary | ICD-10-CM | POA: Diagnosis not present

## 2021-07-19 ENCOUNTER — Telehealth: Payer: Self-pay

## 2021-07-19 DIAGNOSIS — F4310 Post-traumatic stress disorder, unspecified: Secondary | ICD-10-CM

## 2021-07-19 MED ORDER — ARIPIPRAZOLE 15 MG PO TABS: 15.0000 mg | ORAL_TABLET | Freq: Every day | ORAL | 0 refills | Status: DC

## 2021-07-19 NOTE — Telephone Encounter (Signed)
pharamcy called requesting a refill on the abilify.   ( pt has not been seen since 05-16-21 ,  pt canceled appt  [phone] and no showed appt for 07-01-21. ?

## 2021-07-19 NOTE — Telephone Encounter (Signed)
called patient left a message for her to call our office to set up an appt with dr. Vanetta Shawl at her next available.  ?

## 2021-07-23 ENCOUNTER — Telehealth: Payer: Self-pay

## 2021-07-23 DIAGNOSIS — J449 Chronic obstructive pulmonary disease, unspecified: Secondary | ICD-10-CM | POA: Diagnosis not present

## 2021-07-23 NOTE — Telephone Encounter (Signed)
This is Dr. Ivor Reining patient. Pharmacist from [facility] in [place] called requesting a refill on pt's Sertraline 100mg . Pt has no follow up appointments scheduled. Please review and advise. Thank you ?

## 2021-07-23 NOTE — Telephone Encounter (Signed)
Patient has enough sertraline to last until May. ?Please contact patient to schedule an appointment with Dr. Vanetta Shawl . ? ?If she needs any future refills, will provide it once she makes the appointment. ?

## 2021-07-24 DIAGNOSIS — D649 Anemia, unspecified: Secondary | ICD-10-CM | POA: Diagnosis not present

## 2021-07-24 DIAGNOSIS — Z6827 Body mass index (BMI) 27.0-27.9, adult: Secondary | ICD-10-CM | POA: Diagnosis not present

## 2021-07-24 DIAGNOSIS — E1165 Type 2 diabetes mellitus with hyperglycemia: Secondary | ICD-10-CM | POA: Diagnosis not present

## 2021-07-24 DIAGNOSIS — J9611 Chronic respiratory failure with hypoxia: Secondary | ICD-10-CM | POA: Diagnosis not present

## 2021-07-24 DIAGNOSIS — I10 Essential (primary) hypertension: Secondary | ICD-10-CM | POA: Diagnosis not present

## 2021-07-24 DIAGNOSIS — Z299 Encounter for prophylactic measures, unspecified: Secondary | ICD-10-CM | POA: Diagnosis not present

## 2021-07-24 DIAGNOSIS — E1151 Type 2 diabetes mellitus with diabetic peripheral angiopathy without gangrene: Secondary | ICD-10-CM | POA: Diagnosis not present

## 2021-07-25 ENCOUNTER — Telehealth: Payer: Self-pay

## 2021-07-25 DIAGNOSIS — F4310 Post-traumatic stress disorder, unspecified: Secondary | ICD-10-CM

## 2021-07-25 MED ORDER — SERTRALINE HCL 100 MG PO TABS: 150.0000 mg | ORAL_TABLET | Freq: Every day | ORAL | 0 refills | Status: DC

## 2021-07-25 NOTE — Telephone Encounter (Signed)
Katherine Romero from [facility] called about Zoloft Refill request. She states that she still has not received for this patient.  ?

## 2021-07-25 NOTE — Telephone Encounter (Signed)
Did not send her Zoloft prescription earlier since she did not have an appointment scheduled. ? ?Patient has an appointment currently scheduled for 5/11. ? ?We will send Zoloft to pharmacy. ?

## 2021-07-30 ENCOUNTER — Other Ambulatory Visit: Payer: Self-pay | Admitting: Psychiatry

## 2021-07-30 ENCOUNTER — Telehealth: Payer: Self-pay

## 2021-07-30 DIAGNOSIS — F4310 Post-traumatic stress disorder, unspecified: Secondary | ICD-10-CM

## 2021-07-30 MED ORDER — ARIPIPRAZOLE 15 MG PO TABS: 15.0000 mg | ORAL_TABLET | Freq: Every day | ORAL | 0 refills | Status: DC

## 2021-07-30 NOTE — Telephone Encounter (Signed)
ordered

## 2021-07-30 NOTE — Telephone Encounter (Signed)
message was left that pt needs a refill on the aripiprazole  ?

## 2021-08-07 NOTE — Progress Notes (Signed)
Virtual Visit via Telephone Note ? ?I connected with Katherine Romero on 08/08/21 at  3:30 PM EDT by telephone and verified that I am speaking with the correct person using two identifiers. ? ?Location: ?Patient: home ?Provider: office ?Persons participated in the visit- patient, provider  ?  ?I discussed the limitations, risks, security and privacy concerns of performing an evaluation and management service by telephone and the availability of in person appointments. I also discussed with the patient that there may be a patient responsible charge related to this service. The patient expressed understanding and agreed to proceed. ? ?  ?I discussed the assessment and treatment plan with the patient. The patient was provided an opportunity to ask questions and all were answered. The patient agreed with the plan and demonstrated an understanding of the instructions. ?  ?The patient was advised to call back or seek an in-person evaluation if the symptoms worsen or if the condition fails to improve as anticipated. ? ?I provided 13 minutes of non-face-to-face time during this encounter. ? ? ?Norman Clay, MD ? ? ? ?BH MD/PA/NP OP Progress Note ? ?08/08/2021 4:01 PM ?Katherine Romero  ?MRN:  [id number] ? ?Chief Complaint:  ?Chief Complaint  ?Patient presents with  ? Follow-up  ? Trauma  ? ?HPI:  ?This is a follow-up appointment for PTSD, depression.  ?She states that things are fine.  She was found to have ulcer, and she has not had any panic attacks since the treatment.  She states that the relationship with her husband is not good, stating that it is "like leave you alone" situation.  She reports good support from her sister, who encourages her to take a walk twice a week.  She feels depressed.  She does not want to do anything including watching TV.  She feels anxious.  She has fair appetite.  She denies SI.  She denies AH, VH.  She feels vague paranoia that people might be against her.  She has not noticed much  difference in her memory.  She feels comfortable to stay on the current medication regimen at this time.  ? ? ?Daily routine- Clean the house, watch TV, work on yard. 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 ?Visit Diagnosis:  ?  ICD-10-CM   ?1. Current mild episode of major depressive disorder without prior episode ([place])  F32.0   ?  ?2. PTSD (post-traumatic stress disorder)  F43.10 ARIPiprazole (ABILIFY) 15 MG tablet  ?  ?3. Cognitive deficits  R41.89   ?  ? ? ?Past Psychiatric History: Please see initial evaluation for full details. I have reviewed the history. No updates at this time.  ?  ? ?Past Medical History:  ?Past Medical History:  ?Diagnosis Date  ? Anemia   ? Arthritis   ? CHF (congestive heart failure) ([place])   ? COPD (chronic obstructive pulmonary disease) ([place])   ? Depression   ? Diabetes mellitus without complication ([place])   ? GERD (gastroesophageal reflux disease)   ? Hypercholesteremia   ? Hypertension   ?  ?Past Surgical History:  ?Procedure Laterality Date  ? CARPAL TUNNEL RELEASE Left   ? CATARACT EXTRACTION W/PHACO Left 06/08/2017  ? Procedure: CATARACT EXTRACTION PHACO AND INTRAOCULAR LENS PLACEMENT (IOC);  Surgeon: Tonny Branch, MD;  Location: [facility];  Service: Ophthalmology;  Laterality: Left;  CDE: 5.62  ? CATARACT EXTRACTION W/PHACO Right 06/22/2017  ? Procedure: CATARACT EXTRACTION WITH PHACOEMULSIFICATION  AND INTRAOCULAR LENS PLACEMENT RIGHT EYE;  Surgeon: Tonny Branch, MD;  Location: [facility];  Service: Ophthalmology;  Laterality: Right;  CDE: 5.62  ? KNEE SURGERY Right   ? ULNAR NERVE TRANSPOSITION Left   ? WISDOM TOOTH EXTRACTION    ? ? ?Family Psychiatric History: Please see initial evaluation for full details. I have reviewed the history. No updates at this time.  ?   ? ?Family History:  ?Family History  ?Problem Relation Age of Onset  ? Anemia Son   ? Depression Mother   ? Suicidality Cousin   ? ? ?Social History:  ?Social History  ? ?Socioeconomic History  ? Marital status: Married  ?  Spouse name: Not on file  ? Number of children: Not on file  ? Years of education: Not on file  ? Highest education level: Not on file  ?Occupational History  ? Not on file  ?Tobacco Use  ? Smoking status: Every Day  ?  Packs/day: 2.00  ?  Years: 50.00  ?  Pack years: 100.00  ?  Types: Cigarettes  ? Smokeless tobacco: Never  ?Vaping Use  ? Vaping Use: Never used  ?Substance and Sexual Activity  ? Alcohol use: No  ? Drug use: No  ? Sexual activity: Never  ?  Birth control/protection: Post-menopausal  ?Other Topics Concern  ? Not on file  ?Social History Narrative  ? Not on file  ? ?Social Determinants of Health  ? ?Financial Resource Strain: Not on file  ?Food Insecurity: Not on file  ?Transportation Needs: Not on file  ?Physical Activity: Not on file  ?Stress: Not on file  ?Social Connections: Not on file  ? ? ?Allergies: No Known Allergies ? ?Metabolic Disorder Labs: ?Lab Results  ?Component Value Date  ? HGBA1C 6.8 (H) 06/02/2017  ? MPG 148.46 06/02/2017  ? MPG 134 07/19/2015  ? ?No results found for: PROLACTIN ?No results found for: CHOL, TRIG, HDL, CHOLHDL, VLDL, LDLCALC ?Lab Results  ?Component Value Date  ? TSH 3.983 09/26/2015  ? ? ?Therapeutic Level Labs: ?No results found for: LITHIUM ?No results found for: VALPROATE ?No components found for:  CBMZ ? ?Current Medications: ?Current Outpatient Medications  ?Medication Sig Dispense Refill  ? albuterol (PROVENTIL) (2.5 MG/3ML) 0.083% nebulizer solution INHALE ONE VIAL IN NEBULIZER EVERY 6 HOURS AS NEEDED FOR WHEEZING-SHORTNESS OF BREATH  3  ? Albuterol Sulfate (PROAIR RESPICLICK) 123XX123 (90 Base) MCG/ACT AEPB Inhale 2 puffs into the lungs every 6 (six) hours as needed (for wheezing/shortness of breath).     ? amLODipine (NORVASC) 10 MG tablet  Take 1 tablet by mouth daily.    ? [START ON 08/30/2021] ARIPiprazole (ABILIFY) 15 MG tablet Take 1 tablet (15 mg total) by mouth daily. 90 tablet 0  ? aspirin 81 MG EC tablet Take by mouth.    ? Aspirin-Salicylamide-Caffeine (BC HEADACHE POWDER PO) Take 1 Package by mouth every 4 (four) hours as needed (PAIN).    ? atorvastatin (LIPITOR) 40 MG tablet Take 40 mg by mouth daily.    ? budesonide-formoterol (SYMBICORT) 160-4.5 MCG/ACT inhaler Inhale 2 puffs into the lungs 2 (two) times daily.    ? ENSURE PLUS (ENSURE PLUS) LIQD Take 237 mLs by mouth 2 (two) times daily between meals.    ? FEROSUL 325 (65 Fe) MG tablet Take 325 mg by mouth  daily.    ? furosemide (LASIX) 20 MG tablet Take 20 mg by mouth 2 (two) times daily.    ? gemfibrozil (LOPID) 600 MG tablet Take 600 mg by mouth 2 (two) times daily.    ? hydrochlorothiazide (HYDRODIURIL) 25 MG tablet Take 1 tablet by mouth daily.    ? JARDIANCE 10 MG TABS tablet Take 10 mg by mouth daily.    ? metFORMIN (GLUCOPHAGE) 500 MG tablet Take 1 tablet by mouth daily.    ? metoprolol (LOPRESSOR) 100 MG tablet Take 100 mg by mouth 2 (two) times daily.    ? metoprolol succinate (TOPROL-XL) 25 MG 24 hr tablet Take 25 mg by mouth 2 (two) times daily.    ? sertraline (ZOLOFT) 100 MG tablet Take 1.5 tablets (150 mg total) by mouth daily. 135 tablet 0  ? ?No current facility-administered medications for this visit.  ? ? ? ?Musculoskeletal: ?Strength & Muscle Tone:  N/A ?Gait & Station:  N/A ?Patient leans: N/A ? ?Psychiatric Specialty Exam: ?Review of Systems  ?Psychiatric/Behavioral:  Positive for dysphoric mood and sleep disturbance. Negative for agitation, behavioral problems, confusion, decreased concentration, hallucinations, self-injury and suicidal ideas. The patient is nervous/anxious. The patient is not hyperactive.   ?All other systems reviewed and are negative.  ?There were no vitals taken for this visit.There is no height or weight on file to calculate BMI.  ?General  Appearance: NA  ?Eye Contact:  NA  ?Speech:  Clear and Coherent  ?Volume:  Normal  ?Mood:  Depressed  ?Affect:  NA  ?Thought Process:  Coherent  ?Orientation:  Full (Time, Place, and Person)  ?Thought Content: Idelle Leech

## 2021-08-08 ENCOUNTER — Telehealth (INDEPENDENT_AMBULATORY_CARE_PROVIDER_SITE_OTHER): Payer: Medicare HMO | Admitting: Psychiatry

## 2021-08-08 ENCOUNTER — Encounter: Payer: Self-pay | Admitting: Psychiatry

## 2021-08-08 DIAGNOSIS — R4189 Other symptoms and signs involving cognitive functions and awareness: Secondary | ICD-10-CM

## 2021-08-08 DIAGNOSIS — F320 Major depressive disorder, single episode, mild: Secondary | ICD-10-CM | POA: Diagnosis not present

## 2021-08-08 DIAGNOSIS — R69 Illness, unspecified: Secondary | ICD-10-CM | POA: Diagnosis not present

## 2021-08-08 DIAGNOSIS — F4310 Post-traumatic stress disorder, unspecified: Secondary | ICD-10-CM | POA: Diagnosis not present

## 2021-08-08 MED ORDER — ARIPIPRAZOLE 15 MG PO TABS: 15.0000 mg | ORAL_TABLET | Freq: Every day | ORAL | 0 refills | Status: DC

## 2021-08-14 DIAGNOSIS — J449 Chronic obstructive pulmonary disease, unspecified: Secondary | ICD-10-CM | POA: Diagnosis not present

## 2021-08-19 DIAGNOSIS — J449 Chronic obstructive pulmonary disease, unspecified: Secondary | ICD-10-CM | POA: Diagnosis not present

## 2021-08-28 DIAGNOSIS — R509 Fever, unspecified: Secondary | ICD-10-CM | POA: Diagnosis not present

## 2021-08-28 DIAGNOSIS — R69 Illness, unspecified: Secondary | ICD-10-CM | POA: Diagnosis not present

## 2021-08-28 DIAGNOSIS — I10 Essential (primary) hypertension: Secondary | ICD-10-CM | POA: Diagnosis not present

## 2021-08-28 DIAGNOSIS — F17210 Nicotine dependence, cigarettes, uncomplicated: Secondary | ICD-10-CM | POA: Diagnosis not present

## 2021-08-28 DIAGNOSIS — Z299 Encounter for prophylactic measures, unspecified: Secondary | ICD-10-CM | POA: Diagnosis not present

## 2021-08-28 DIAGNOSIS — Z6827 Body mass index (BMI) 27.0-27.9, adult: Secondary | ICD-10-CM | POA: Diagnosis not present

## 2021-08-28 DIAGNOSIS — J441 Chronic obstructive pulmonary disease with (acute) exacerbation: Secondary | ICD-10-CM | POA: Diagnosis not present

## 2021-09-14 DIAGNOSIS — J449 Chronic obstructive pulmonary disease, unspecified: Secondary | ICD-10-CM | POA: Diagnosis not present

## 2021-09-23 DIAGNOSIS — J449 Chronic obstructive pulmonary disease, unspecified: Secondary | ICD-10-CM | POA: Diagnosis not present

## 2021-10-08 DIAGNOSIS — Z6829 Body mass index (BMI) 29.0-29.9, adult: Secondary | ICD-10-CM | POA: Diagnosis not present

## 2021-10-08 DIAGNOSIS — I10 Essential (primary) hypertension: Secondary | ICD-10-CM | POA: Diagnosis not present

## 2021-10-08 DIAGNOSIS — I509 Heart failure, unspecified: Secondary | ICD-10-CM | POA: Diagnosis not present

## 2021-10-08 DIAGNOSIS — Z0000 Encounter for general adult medical examination without abnormal findings: Secondary | ICD-10-CM | POA: Diagnosis not present

## 2021-10-08 DIAGNOSIS — J449 Chronic obstructive pulmonary disease, unspecified: Secondary | ICD-10-CM | POA: Diagnosis not present

## 2021-10-08 DIAGNOSIS — Z299 Encounter for prophylactic measures, unspecified: Secondary | ICD-10-CM | POA: Diagnosis not present

## 2021-10-08 DIAGNOSIS — J9611 Chronic respiratory failure with hypoxia: Secondary | ICD-10-CM | POA: Diagnosis not present

## 2021-10-14 DIAGNOSIS — J449 Chronic obstructive pulmonary disease, unspecified: Secondary | ICD-10-CM | POA: Diagnosis not present

## 2021-10-16 ENCOUNTER — Encounter: Payer: Self-pay | Admitting: Psychology

## 2021-10-16 ENCOUNTER — Encounter: Payer: Medicare HMO | Admitting: Psychology

## 2021-10-16 DIAGNOSIS — E785 Hyperlipidemia, unspecified: Secondary | ICD-10-CM | POA: Insufficient documentation

## 2021-10-16 DIAGNOSIS — K219 Gastro-esophageal reflux disease without esophagitis: Secondary | ICD-10-CM | POA: Insufficient documentation

## 2021-10-16 DIAGNOSIS — Z029 Encounter for administrative examinations, unspecified: Secondary | ICD-10-CM

## 2021-10-16 DIAGNOSIS — F4310 Post-traumatic stress disorder, unspecified: Secondary | ICD-10-CM | POA: Insufficient documentation

## 2021-10-16 DIAGNOSIS — E119 Type 2 diabetes mellitus without complications: Secondary | ICD-10-CM | POA: Insufficient documentation
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
## 2021-10-17 ENCOUNTER — Telehealth (INDEPENDENT_AMBULATORY_CARE_PROVIDER_SITE_OTHER): Payer: Medicare HMO | Admitting: Psychiatry

## 2021-10-17 ENCOUNTER — Encounter: Payer: Self-pay | Admitting: Psychiatry

## 2021-10-17 DIAGNOSIS — F320 Major depressive disorder, single episode, mild: Secondary | ICD-10-CM | POA: Diagnosis not present

## 2021-10-17 DIAGNOSIS — F4310 Post-traumatic stress disorder, unspecified: Secondary | ICD-10-CM | POA: Diagnosis not present

## 2021-10-17 DIAGNOSIS — R4189 Other symptoms and signs involving cognitive functions and awareness: Secondary | ICD-10-CM

## 2021-10-17 MED ORDER — SERTRALINE HCL 100 MG PO TABS: 150.0000 mg | ORAL_TABLET | Freq: Every day | ORAL | 0 refills | Status: DC

## 2021-10-17 NOTE — Patient Instructions (Signed)
Continue sertraline 150 mg daily Continue Abilify 15 mg once a day  Obtain blood test- TSH, vitamin B 12   Next appointment: 8/29 at 1:30

## 2021-10-18 DIAGNOSIS — J449 Chronic obstructive pulmonary disease, unspecified: Secondary | ICD-10-CM | POA: Diagnosis not present

## 2021-10-23 DIAGNOSIS — I509 Heart failure, unspecified: Secondary | ICD-10-CM | POA: Diagnosis not present

## 2021-10-23 DIAGNOSIS — Z6829 Body mass index (BMI) 29.0-29.9, adult: Secondary | ICD-10-CM | POA: Diagnosis not present

## 2021-10-23 DIAGNOSIS — F17210 Nicotine dependence, cigarettes, uncomplicated: Secondary | ICD-10-CM | POA: Diagnosis not present

## 2021-10-23 DIAGNOSIS — R634 Abnormal weight loss: Secondary | ICD-10-CM | POA: Diagnosis not present

## 2021-10-23 DIAGNOSIS — J441 Chronic obstructive pulmonary disease with (acute) exacerbation: Secondary | ICD-10-CM | POA: Diagnosis not present

## 2021-10-23 DIAGNOSIS — R0602 Shortness of breath: Secondary | ICD-10-CM | POA: Diagnosis not present

## 2021-10-23 DIAGNOSIS — J439 Emphysema, unspecified: Secondary | ICD-10-CM | POA: Diagnosis not present

## 2021-10-23 DIAGNOSIS — Z299 Encounter for prophylactic measures, unspecified: Secondary | ICD-10-CM | POA: Diagnosis not present

## 2021-10-23 DIAGNOSIS — R69 Illness, unspecified: Secondary | ICD-10-CM | POA: Diagnosis not present

## 2021-10-23 DIAGNOSIS — T733XXA Exhaustion due to excessive exertion, initial encounter: Secondary | ICD-10-CM | POA: Diagnosis not present

## 2021-10-23 DIAGNOSIS — R350 Frequency of micturition: Secondary | ICD-10-CM | POA: Diagnosis not present

## 2021-10-23 DIAGNOSIS — E1165 Type 2 diabetes mellitus with hyperglycemia: Secondary | ICD-10-CM | POA: Diagnosis not present

## 2021-10-24 ENCOUNTER — Encounter: Payer: Medicare HMO | Admitting: Psychology

## 2021-10-25 DIAGNOSIS — F17210 Nicotine dependence, cigarettes, uncomplicated: Secondary | ICD-10-CM | POA: Diagnosis not present

## 2021-10-25 DIAGNOSIS — R69 Illness, unspecified: Secondary | ICD-10-CM | POA: Diagnosis not present

## 2021-10-25 DIAGNOSIS — J9611 Chronic respiratory failure with hypoxia: Secondary | ICD-10-CM | POA: Diagnosis not present

## 2021-10-25 DIAGNOSIS — J449 Chronic obstructive pulmonary disease, unspecified: Secondary | ICD-10-CM | POA: Diagnosis not present

## 2021-10-25 DIAGNOSIS — J983 Compensatory emphysema: Secondary | ICD-10-CM | POA: Diagnosis not present

## 2021-10-25 DIAGNOSIS — I10 Essential (primary) hypertension: Secondary | ICD-10-CM | POA: Diagnosis not present

## 2021-10-25 DIAGNOSIS — Z299 Encounter for prophylactic measures, unspecified: Secondary | ICD-10-CM | POA: Diagnosis not present

## 2021-11-11 DIAGNOSIS — I509 Heart failure, unspecified: Secondary | ICD-10-CM | POA: Diagnosis not present

## 2021-11-11 DIAGNOSIS — Z6827 Body mass index (BMI) 27.0-27.9, adult: Secondary | ICD-10-CM | POA: Diagnosis not present

## 2021-11-11 DIAGNOSIS — R69 Illness, unspecified: Secondary | ICD-10-CM | POA: Diagnosis not present

## 2021-11-11 DIAGNOSIS — F17210 Nicotine dependence, cigarettes, uncomplicated: Secondary | ICD-10-CM | POA: Diagnosis not present

## 2021-11-11 DIAGNOSIS — I10 Essential (primary) hypertension: Secondary | ICD-10-CM | POA: Diagnosis not present

## 2021-11-11 DIAGNOSIS — E1165 Type 2 diabetes mellitus with hyperglycemia: Secondary | ICD-10-CM | POA: Diagnosis not present

## 2021-11-11 DIAGNOSIS — Z299 Encounter for prophylactic measures, unspecified: Secondary | ICD-10-CM | POA: Diagnosis not present

## 2021-11-11 DIAGNOSIS — J449 Chronic obstructive pulmonary disease, unspecified: Secondary | ICD-10-CM | POA: Diagnosis not present

## 2021-11-12 DIAGNOSIS — J449 Chronic obstructive pulmonary disease, unspecified: Secondary | ICD-10-CM | POA: Diagnosis not present

## 2021-11-14 DIAGNOSIS — J449 Chronic obstructive pulmonary disease, unspecified: Secondary | ICD-10-CM | POA: Diagnosis not present

## 2021-11-14 DIAGNOSIS — I509 Heart failure, unspecified: Secondary | ICD-10-CM | POA: Diagnosis not present
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
## 2021-11-25 ENCOUNTER — Telehealth: Payer: Self-pay | Admitting: Psychiatry

## 2021-11-25 DIAGNOSIS — F320 Major depressive disorder, single episode, mild: Secondary | ICD-10-CM

## 2021-11-25 DIAGNOSIS — F4310 Post-traumatic stress disorder, unspecified: Secondary | ICD-10-CM

## 2021-11-25 MED ORDER — ARIPIPRAZOLE 15 MG PO TABS: 15.0000 mg | ORAL_TABLET | Freq: Every day | ORAL | 0 refills | Status: DC

## 2021-11-25 NOTE — Telephone Encounter (Signed)
[facility] in [place] calling to see if fax was received Friday, for patient's Ambilify. It will run out today

## 2021-11-25 NOTE — Telephone Encounter (Signed)
I have sent Abilify 15 mg daily to pharmacy.

## 2021-11-25 NOTE — Telephone Encounter (Signed)
left message on 11-25-21 @# 2:56 that rx was sent into pharamcy

## 2021-11-26 ENCOUNTER — Ambulatory Visit (INDEPENDENT_AMBULATORY_CARE_PROVIDER_SITE_OTHER): Payer: Medicare HMO | Admitting: Psychiatry

## 2021-11-26 ENCOUNTER — Encounter: Payer: Self-pay | Admitting: Psychiatry

## 2021-11-26 VITALS — BP 147/76 | HR 109 | Temp 97.9°F | Ht 59.65 in | Wt 145.2 lb

## 2021-11-26 DIAGNOSIS — R4189 Other symptoms and signs involving cognitive functions and awareness: Secondary | ICD-10-CM

## 2021-11-26 DIAGNOSIS — F4310 Post-traumatic stress disorder, unspecified: Secondary | ICD-10-CM | POA: Diagnosis not present

## 2021-11-26 DIAGNOSIS — R69 Illness, unspecified: Secondary | ICD-10-CM | POA: Diagnosis not present

## 2021-11-26 DIAGNOSIS — F320 Major depressive disorder, single episode, mild: Secondary | ICD-10-CM

## 2021-11-26 MED ORDER — TRAZODONE HCL 50 MG PO TABS: 25.0000 mg | ORAL_TABLET | Freq: Every evening | ORAL | 0 refills | Status: AC | PRN

## 2021-11-26 MED ORDER — SERTRALINE HCL 100 MG PO TABS: 150.0000 mg | ORAL_TABLET | Freq: Every day | ORAL | 0 refills | Status: DC

## 2021-11-28 DIAGNOSIS — I509 Heart failure, unspecified: Secondary | ICD-10-CM | POA: Diagnosis not present

## 2021-11-28 DIAGNOSIS — E1151 Type 2 diabetes mellitus with diabetic peripheral angiopathy without gangrene: Secondary | ICD-10-CM | POA: Diagnosis not present

## 2021-11-28 DIAGNOSIS — K219 Gastro-esophageal reflux disease without esophagitis: Secondary | ICD-10-CM | POA: Diagnosis not present

## 2021-11-28 DIAGNOSIS — I110 Hypertensive heart disease with heart failure: Secondary | ICD-10-CM | POA: Diagnosis not present

## 2021-12-06 DIAGNOSIS — J449 Chronic obstructive pulmonary disease, unspecified: Secondary | ICD-10-CM | POA: Diagnosis not present

## 2021-12-10 DIAGNOSIS — Z299 Encounter for prophylactic measures, unspecified: Secondary | ICD-10-CM | POA: Diagnosis not present

## 2021-12-10 DIAGNOSIS — R69 Illness, unspecified: Secondary | ICD-10-CM | POA: Diagnosis not present

## 2021-12-10 DIAGNOSIS — I10 Essential (primary) hypertension: Secondary | ICD-10-CM | POA: Diagnosis not present

## 2021-12-10 DIAGNOSIS — J449 Chronic obstructive pulmonary disease, unspecified: Secondary | ICD-10-CM | POA: Diagnosis not present

## 2021-12-10 DIAGNOSIS — Z23 Encounter for immunization: Secondary | ICD-10-CM | POA: Diagnosis not present

## 2021-12-10 DIAGNOSIS — E1151 Type 2 diabetes mellitus with diabetic peripheral angiopathy without gangrene: Secondary | ICD-10-CM | POA: Diagnosis not present

## 2021-12-10 DIAGNOSIS — I509 Heart failure, unspecified: Secondary | ICD-10-CM | POA: Diagnosis not present

## 2021-12-10 DIAGNOSIS — F17210 Nicotine dependence, cigarettes, uncomplicated: Secondary | ICD-10-CM | POA: Diagnosis not present

## 2021-12-15 DIAGNOSIS — J449 Chronic obstructive pulmonary disease, unspecified: Secondary | ICD-10-CM | POA: Diagnosis not present

## 2021-12-15 DIAGNOSIS — I509 Heart failure, unspecified: Secondary | ICD-10-CM | POA: Diagnosis not present

## 2022-01-14 DIAGNOSIS — R509 Fever, unspecified: Secondary | ICD-10-CM | POA: Diagnosis not present

## 2022-01-14 DIAGNOSIS — R69 Illness, unspecified: Secondary | ICD-10-CM | POA: Diagnosis not present

## 2022-01-14 DIAGNOSIS — I509 Heart failure, unspecified: Secondary | ICD-10-CM | POA: Diagnosis not present

## 2022-01-14 DIAGNOSIS — Z299 Encounter for prophylactic measures, unspecified: Secondary | ICD-10-CM | POA: Diagnosis not present

## 2022-01-14 DIAGNOSIS — F17210 Nicotine dependence, cigarettes, uncomplicated: Secondary | ICD-10-CM | POA: Diagnosis not present

## 2022-01-14 DIAGNOSIS — J40 Bronchitis, not specified as acute or chronic: Secondary | ICD-10-CM | POA: Diagnosis not present

## 2022-01-14 DIAGNOSIS — J449 Chronic obstructive pulmonary disease, unspecified: Secondary | ICD-10-CM | POA: Diagnosis not present

## 2022-01-14 DIAGNOSIS — R0602 Shortness of breath: Secondary | ICD-10-CM | POA: Diagnosis not present

## 2022-01-14 DIAGNOSIS — J439 Emphysema, unspecified: Secondary | ICD-10-CM | POA: Diagnosis not present

## 2022-01-14 DIAGNOSIS — R059 Cough, unspecified: Secondary | ICD-10-CM | POA: Diagnosis not present

## 2022-01-17 DIAGNOSIS — Z299 Encounter for prophylactic measures, unspecified: Secondary | ICD-10-CM | POA: Diagnosis not present

## 2022-01-17 DIAGNOSIS — I509 Heart failure, unspecified: Secondary | ICD-10-CM | POA: Diagnosis not present

## 2022-01-17 DIAGNOSIS — B029 Zoster without complications: Secondary | ICD-10-CM | POA: Diagnosis not present

## 2022-01-17 DIAGNOSIS — I10 Essential (primary) hypertension: Secondary | ICD-10-CM | POA: Diagnosis not present

## 2022-01-20 DIAGNOSIS — J449 Chronic obstructive pulmonary disease, unspecified: Secondary | ICD-10-CM | POA: Diagnosis not present

## 2022-01-31 DIAGNOSIS — I10 Essential (primary) hypertension: Secondary | ICD-10-CM | POA: Diagnosis not present

## 2022-01-31 DIAGNOSIS — Z299 Encounter for prophylactic measures, unspecified: Secondary | ICD-10-CM | POA: Diagnosis not present

## 2022-01-31 DIAGNOSIS — B0229 Other postherpetic nervous system involvement: Secondary | ICD-10-CM | POA: Diagnosis not present

## 2022-02-14 DIAGNOSIS — I509 Heart failure, unspecified: Secondary | ICD-10-CM | POA: Diagnosis not present

## 2022-02-14 DIAGNOSIS — J449 Chronic obstructive pulmonary disease, unspecified: Secondary | ICD-10-CM | POA: Diagnosis not present

## 2022-02-17 DIAGNOSIS — Z299 Encounter for prophylactic measures, unspecified: Secondary | ICD-10-CM | POA: Diagnosis not present

## 2022-02-17 DIAGNOSIS — I10 Essential (primary) hypertension: Secondary | ICD-10-CM | POA: Diagnosis not present

## 2022-02-17 DIAGNOSIS — R69 Illness, unspecified: Secondary | ICD-10-CM | POA: Diagnosis not present

## 2022-02-17 DIAGNOSIS — F17210 Nicotine dependence, cigarettes, uncomplicated: Secondary | ICD-10-CM | POA: Diagnosis not present

## 2022-02-17 DIAGNOSIS — K5900 Constipation, unspecified: Secondary | ICD-10-CM | POA: Diagnosis not present

## 2022-02-17 DIAGNOSIS — E1151 Type 2 diabetes mellitus with diabetic peripheral angiopathy without gangrene: Secondary | ICD-10-CM | POA: Diagnosis not present

## 2022-02-17 DIAGNOSIS — E1165 Type 2 diabetes mellitus with hyperglycemia: Secondary | ICD-10-CM | POA: Diagnosis not present

## 2022-02-18 DIAGNOSIS — J449 Chronic obstructive pulmonary disease, unspecified: Secondary | ICD-10-CM | POA: Diagnosis not present
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
## 2022-02-19 ENCOUNTER — Encounter: Payer: Self-pay | Admitting: Psychiatry

## 2022-02-19 ENCOUNTER — Telehealth (INDEPENDENT_AMBULATORY_CARE_PROVIDER_SITE_OTHER): Payer: Medicare HMO | Admitting: Psychiatry

## 2022-02-19 ENCOUNTER — Other Ambulatory Visit: Payer: Self-pay | Admitting: Psychiatry

## 2022-02-19 DIAGNOSIS — R4189 Other symptoms and signs involving cognitive functions and awareness: Secondary | ICD-10-CM

## 2022-02-19 DIAGNOSIS — F320 Major depressive disorder, single episode, mild: Secondary | ICD-10-CM | POA: Diagnosis not present

## 2022-02-19 DIAGNOSIS — F4310 Post-traumatic stress disorder, unspecified: Secondary | ICD-10-CM | POA: Diagnosis not present

## 2022-02-19 DIAGNOSIS — R69 Illness, unspecified: Secondary | ICD-10-CM | POA: Diagnosis not present

## 2022-02-19 NOTE — Telephone Encounter (Signed)
Defer to Dr.Hisada 

## 2022-02-25 DIAGNOSIS — Z299 Encounter for prophylactic measures, unspecified: Secondary | ICD-10-CM | POA: Diagnosis not present

## 2022-02-25 DIAGNOSIS — K5900 Constipation, unspecified: Secondary | ICD-10-CM | POA: Diagnosis not present

## 2022-02-25 DIAGNOSIS — I10 Essential (primary) hypertension: Secondary | ICD-10-CM | POA: Diagnosis not present

## 2022-02-25 DIAGNOSIS — Z713 Dietary counseling and surveillance: Secondary | ICD-10-CM | POA: Diagnosis not present

## 2022-02-25 DIAGNOSIS — I509 Heart failure, unspecified: Secondary | ICD-10-CM | POA: Diagnosis not present

## 2022-03-06 DIAGNOSIS — H353122 Nonexudative age-related macular degeneration, left eye, intermediate dry stage: Secondary | ICD-10-CM | POA: Diagnosis not present

## 2022-03-06 DIAGNOSIS — H524 Presbyopia: Secondary | ICD-10-CM | POA: Diagnosis not present

## 2022-03-06 DIAGNOSIS — Z0100 Encounter for examination of eyes and vision without abnormal findings: Secondary | ICD-10-CM | POA: Diagnosis not present

## 2022-03-16 DIAGNOSIS — J449 Chronic obstructive pulmonary disease, unspecified: Secondary | ICD-10-CM | POA: Diagnosis not present

## 2022-03-16 DIAGNOSIS — I509 Heart failure, unspecified: Secondary | ICD-10-CM | POA: Diagnosis not present

## 2022-03-26 DIAGNOSIS — J449 Chronic obstructive pulmonary disease, unspecified: Secondary | ICD-10-CM | POA: Diagnosis not present

## 2022-04-16 DIAGNOSIS — I509 Heart failure, unspecified: Secondary | ICD-10-CM | POA: Diagnosis not present

## 2022-04-16 DIAGNOSIS — J449 Chronic obstructive pulmonary disease, unspecified: Secondary | ICD-10-CM | POA: Diagnosis not present

## 2022-04-25 DIAGNOSIS — I509 Heart failure, unspecified: Secondary | ICD-10-CM | POA: Diagnosis not present

## 2022-04-25 DIAGNOSIS — Z299 Encounter for prophylactic measures, unspecified: Secondary | ICD-10-CM | POA: Diagnosis not present

## 2022-04-25 DIAGNOSIS — E1151 Type 2 diabetes mellitus with diabetic peripheral angiopathy without gangrene: Secondary | ICD-10-CM | POA: Diagnosis not present

## 2022-04-25 DIAGNOSIS — J441 Chronic obstructive pulmonary disease with (acute) exacerbation: Secondary | ICD-10-CM | POA: Diagnosis not present

## 2022-04-25 DIAGNOSIS — R059 Cough, unspecified: Secondary | ICD-10-CM | POA: Diagnosis not present

## 2022-04-25 DIAGNOSIS — R0602 Shortness of breath: Secondary | ICD-10-CM | POA: Diagnosis not present

## 2022-04-28 DIAGNOSIS — J449 Chronic obstructive pulmonary disease, unspecified: Secondary | ICD-10-CM | POA: Diagnosis not present
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
## 2022-05-12 ENCOUNTER — Ambulatory Visit: Payer: Medicare HMO | Admitting: Psychiatry

## 2022-05-17 DIAGNOSIS — I509 Heart failure, unspecified: Secondary | ICD-10-CM | POA: Diagnosis not present

## 2022-05-17 DIAGNOSIS — J449 Chronic obstructive pulmonary disease, unspecified: Secondary | ICD-10-CM | POA: Diagnosis not present
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
## 2022-05-20 ENCOUNTER — Ambulatory Visit: Payer: Medicare HMO | Admitting: Psychiatry

## 2022-05-23 DIAGNOSIS — J449 Chronic obstructive pulmonary disease, unspecified: Secondary | ICD-10-CM | POA: Diagnosis not present

## 2022-05-27 DIAGNOSIS — I509 Heart failure, unspecified: Secondary | ICD-10-CM | POA: Diagnosis not present

## 2022-05-27 DIAGNOSIS — Z6826 Body mass index (BMI) 26.0-26.9, adult: Secondary | ICD-10-CM | POA: Diagnosis not present

## 2022-05-27 DIAGNOSIS — I10 Essential (primary) hypertension: Secondary | ICD-10-CM | POA: Diagnosis not present

## 2022-05-27 DIAGNOSIS — Z299 Encounter for prophylactic measures, unspecified: Secondary | ICD-10-CM | POA: Diagnosis not present

## 2022-05-27 DIAGNOSIS — E1165 Type 2 diabetes mellitus with hyperglycemia: Secondary | ICD-10-CM | POA: Diagnosis not present

## 2022-06-04 ENCOUNTER — Telehealth: Payer: Self-pay

## 2022-06-04 DIAGNOSIS — F4310 Post-traumatic stress disorder, unspecified: Secondary | ICD-10-CM

## 2022-06-04 MED ORDER — SERTRALINE HCL 100 MG PO TABS: 150.0000 mg | ORAL_TABLET | Freq: Every day | ORAL | 0 refills | Status: AC

## 2022-06-04 NOTE — Telephone Encounter (Signed)
Received a fax from patients pharmacy requesting a refill for the following medication please advise Last visit 02/19/22 Next visit  07/01/22    Disp Refills Start End   sertraline (ZOLOFT) 100 MG tablet 135 tablet 0 01/23/2022 04/23/2022   Sig - Route: Take 1.5 tablets (150 mg total) by mouth daily. - Oral     [facility], [place] Phone: [phone]  Fax: [phone]

## 2022-06-04 NOTE — Telephone Encounter (Signed)
pt called states she needs refill on the zoloft sent to the [facility].

## 2022-06-04 NOTE — Telephone Encounter (Signed)
I have sent sertraline 150 mg to pharmacy per her request.

## 2022-06-05 ENCOUNTER — Encounter: Payer: Self-pay | Admitting: Psychology

## 2022-06-05 ENCOUNTER — Ambulatory Visit: Payer: Medicare HMO | Admitting: Psychology

## 2022-06-05 DIAGNOSIS — R4189 Other symptoms and signs involving cognitive functions and awareness: Secondary | ICD-10-CM

## 2022-06-05 DIAGNOSIS — F411 Generalized anxiety disorder: Secondary | ICD-10-CM | POA: Diagnosis not present

## 2022-06-05 DIAGNOSIS — F330 Major depressive disorder, recurrent, mild: Secondary | ICD-10-CM

## 2022-06-05 DIAGNOSIS — F4310 Post-traumatic stress disorder, unspecified: Secondary | ICD-10-CM

## 2022-06-05 NOTE — Progress Notes (Signed)
   Psychometrician Note   Cognitive testing was administered to Katherine Romero by Milana Kidney, B.S. (psychometrist) under the supervision of Dr. Christia Reading, Ph.D., licensed psychologist on 06/05/2022. Katherine Romero did not appear overtly distressed by the testing session per behavioral observation or responses across self-report questionnaires. Rest breaks were offered.    The battery of tests administered was selected by Dr. Christia Reading, Ph.D. with consideration to Katherine Romero's current level of functioning, the nature of her symptoms, emotional and behavioral responses during interview, level of literacy, observed level of motivation/effort, and the nature of the referral question. This battery was communicated to the psychometrist. Communication between Dr. Christia Reading, Ph.D. and the psychometrist was ongoing throughout the evaluation and Dr. Christia Reading, Ph.D. was immediately accessible at all times. Dr. Christia Reading, Ph.D. provided supervision to the psychometrist on the date of this service to the extent necessary to assure the quality of all services provided.    Katherine Romero will return within approximately 1-2 weeks for an interactive feedback session with Dr. Melvyn Novas at which time her test performances, clinical impressions, and treatment recommendations will be reviewed in detail. Katherine Romero understands she can contact our office should she require our assistance before this time.  A total of 100 minutes of billable time were spent face-to-face with Katherine Romero by the psychometrist. This includes both test administration and scoring time. Billing for these services is reflected in the clinical report generated by Dr. Christia Reading, Ph.D.  This note reflects time spent with the psychometrician and does not include test scores or any clinical interpretations made by Dr. Melvyn Novas. The full report will follow in a separate note.

## 2022-06-05 NOTE — Telephone Encounter (Signed)
Left message that rx has been sent to the pharmacy

## 2022-06-05 NOTE — Progress Notes (Signed)
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        Scaled Score Percentile   WAIS-IV Arithmetic: 7 16 Below Average       D-KEFS Verbal Fluency Test: Raw Score (Scaled Score) Percentile     Letter Total Correct 32 (9) 37 Average    Category Total Correct 30 (9) 37 Average    Category Switching Total Correct 8 (5) 5 Well Below Average    Category Switching Accuracy 7 (6) 9 Below Average      Total Set Loss Errors 0 (13) 84 Above Average      Total Repetition Errors 0 (13) 84 Above Average       NAB Executive Functions Module, Form 1: T Score Percentile     Judgment 65 93 Well Above Average       Language:     Boston Diagnostic Aphasia Examination (BDAE) Raw Score Percentile     Complex Ideational Material: 12/12 --- Average       Verbal Fluency Test: Raw Score (T Score) Percentile     Phonemic Fluency (FAS) 32 (43) 25 Average    Animal Fluency 17 (49) 46 Average        NAB Language Module, Form 1: T Score Percentile     Naming 29/31 (48) 42 Average       Visuospatial/Visuoconstruction:      Raw Score Percentile   Clock Drawing: 9/10 --- Within Normal Limits       NAB Spatial Module, Form 1: T Score Percentile     Figure Drawing  Copy 48 42 Average        Scaled Score Percentile   WAIS-IV Block Design: 7 16 Below Average       Mood and Personality:      Raw Score Percentile   PROMIS Depression Questionnaire: 22 --- Mild  PROMIS Anxiety Questionnaire: 22 --- Moderate       Additional Questionnaires:      Raw Score Percentile   PROMIS Sleep Disturbance Questionnaire: 34 --- Moderate   Informed Consent and Coding/Compliance:   The current evaluation represents a clinical evaluation for the purposes previously outlined by the referral source and is in no way reflective of a forensic evaluation.   Ms. Aumann was provided with a verbal description of the nature and purpose of the present neuropsychological evaluation. Also reviewed were the foreseeable risks and/or discomforts and benefits of the procedure, limits of confidentiality, and mandatory reporting requirements of this provider. The  patient was given the opportunity to ask questions and receive answers about the evaluation. Oral consent to participate was provided by the patient.   This evaluation was conducted by Christia Reading, Ph.D., ABPP-CN, board certified clinical neuropsychologist. Ms. Cote completed a clinical interview with Dr. Melvyn Novas, billed as one unit [phone], and 100 minutes of cognitive testing and scoring, billed as one unit [phone] and two additional units [id number]. Psychometrist Milana Kidney, B.S., assisted Dr. Melvyn Novas with test administration and scoring procedures. As a separate and discrete service, Dr. Melvyn Novas spent a total of 160 minutes in interpretation and report writing billed as one unit [phone] and two units 96133.

## 2022-06-10 DIAGNOSIS — R5383 Other fatigue: Secondary | ICD-10-CM | POA: Diagnosis not present

## 2022-06-10 DIAGNOSIS — R059 Cough, unspecified: Secondary | ICD-10-CM | POA: Diagnosis not present

## 2022-06-10 DIAGNOSIS — I10 Essential (primary) hypertension: Secondary | ICD-10-CM | POA: Diagnosis not present

## 2022-06-10 DIAGNOSIS — Z299 Encounter for prophylactic measures, unspecified: Secondary | ICD-10-CM | POA: Diagnosis not present

## 2022-06-10 DIAGNOSIS — J441 Chronic obstructive pulmonary disease with (acute) exacerbation: Secondary | ICD-10-CM | POA: Diagnosis not present

## 2022-06-10 DIAGNOSIS — I509 Heart failure, unspecified: Secondary | ICD-10-CM | POA: Diagnosis not present

## 2022-06-12 ENCOUNTER — Ambulatory Visit: Payer: Medicare HMO | Admitting: Psychology

## 2022-06-12 DIAGNOSIS — R4189 Other symptoms and signs involving cognitive functions and awareness: Secondary | ICD-10-CM

## 2022-06-12 DIAGNOSIS — F411 Generalized anxiety disorder: Secondary | ICD-10-CM

## 2022-06-12 DIAGNOSIS — F4310 Post-traumatic stress disorder, unspecified: Secondary | ICD-10-CM

## 2022-06-12 DIAGNOSIS — F330 Major depressive disorder, recurrent, mild: Secondary | ICD-10-CM | POA: Diagnosis not present

## 2022-06-12 NOTE — Progress Notes (Signed)
   Neuropsychology Feedback Session Tillie Romero. [facility]  Reason for Referral:   [name] is a 72 y.o. right-handed Caucasian female referred by  Norman Clay, M.D. , to characterize her current cognitive functioning and assist with diagnostic clarity and treatment planning in the context of subjective cognitive dysfunction and numerous psychiatric comorbidities.   Feedback:   [name] completed a comprehensive neuropsychological evaluation on 06/05/2022. Please refer to that encounter for the full report and recommendations. Briefly, results suggested neuropsychological functioning with normal limits relative to age matched peers. Mild variability was exhibited across executive functioning. Performance across all other assessed domains were appropriate. [name] described a long history of psychiatric distress with prominent symptoms surrounding both depression and anxiety. She also reported prior traumatic experiences and a formal diagnosis of PTSD. Regarding sleep, she estimated obtaining 2-3 hours nightly. Medically, she has numerous ailments, including several which could directly impact cerebrovascular functioning (i.e., chronic heart failure, hypertension, hyperlipidemia, aortic valve stenosis, type II diabetes). All of these variables, especially when combined, can certainly create mild variability across executive functioning seen across testing, as well as explain subjective concerns expressed by [name] during interview. This represents the most likely culprit for her current presentation. Her cognitive and behavioral profile is not suggestive of any other form of neurodegenerative illness presently.   [name] was accompanied by her husband during the current feedback session. Content of the current session focused on the results of her neuropsychological evaluation. [name] was given the opportunity to ask questions and her  questions were answered. She was encouraged to reach out should additional questions arise. A copy of her report was provided at the conclusion of the visit.      Greater than 31 minutes were spent preparing for, conducting, and documenting the current feedback session with [name], billed as one unit [phone].

## 2022-06-15 DIAGNOSIS — J449 Chronic obstructive pulmonary disease, unspecified: Secondary | ICD-10-CM | POA: Diagnosis not present

## 2022-06-15 DIAGNOSIS — I509 Heart failure, unspecified: Secondary | ICD-10-CM | POA: Diagnosis not present

## 2022-06-23 DIAGNOSIS — J449 Chronic obstructive pulmonary disease, unspecified: Secondary | ICD-10-CM | POA: Diagnosis not present
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
## 2022-07-01 ENCOUNTER — Other Ambulatory Visit
Admission: RE | Admit: 2022-07-01 | Discharge: 2022-07-01 | Disposition: A | Discharge status: Home / Self Care | Payer: Medicare HMO | Source: Ambulatory Visit | Attending: Psychiatry | Admitting: Psychiatry

## 2022-07-01 ENCOUNTER — Telehealth: Payer: Self-pay

## 2022-07-01 ENCOUNTER — Ambulatory Visit (INDEPENDENT_AMBULATORY_CARE_PROVIDER_SITE_OTHER): Payer: Medicare HMO | Admitting: Psychiatry

## 2022-07-01 ENCOUNTER — Encounter: Payer: Self-pay | Admitting: Psychiatry

## 2022-07-01 ENCOUNTER — Other Ambulatory Visit: Payer: Self-pay | Admitting: Psychiatry

## 2022-07-01 VITALS — BP 195/89 | HR 104 | Temp 97.6°F | Ht 59.0 in | Wt 138.0 lb

## 2022-07-01 DIAGNOSIS — R4189 Other symptoms and signs involving cognitive functions and awareness: Secondary | ICD-10-CM | POA: Diagnosis not present

## 2022-07-01 DIAGNOSIS — D509 Iron deficiency anemia, unspecified: Secondary | ICD-10-CM | POA: Insufficient documentation

## 2022-07-01 DIAGNOSIS — F4310 Post-traumatic stress disorder, unspecified: Secondary | ICD-10-CM

## 2022-07-01 DIAGNOSIS — F331 Major depressive disorder, recurrent, moderate: Secondary | ICD-10-CM

## 2022-07-01 DIAGNOSIS — R69 Illness, unspecified: Secondary | ICD-10-CM | POA: Diagnosis not present

## 2022-07-01 DIAGNOSIS — F320 Major depressive disorder, single episode, mild: Secondary | ICD-10-CM

## 2022-07-01 LAB — CBC
HCT: 32.6 % — ABNORMAL LOW (ref 36.0–46.0)
Hemoglobin: 9.9 g/dL — ABNORMAL LOW (ref 12.0–15.0)
MCH: 24.8 pg — ABNORMAL LOW (ref 26.0–34.0)
MCHC: 30.4 g/dL (ref 30.0–36.0)
MCV: 81.7 fL (ref 80.0–100.0)
Platelets: 239 10*3/uL (ref 150–400)
RBC: 3.99 MIL/uL (ref 3.87–5.11)
RDW: 15.8 % — ABNORMAL HIGH (ref 11.5–15.5)
WBC: 7.3 10*3/uL (ref 4.0–10.5)
nRBC: 0 % (ref 0.0–0.2)

## 2022-07-01 LAB — BASIC METABOLIC PANEL
Anion gap: 9 (ref 5–15)
BUN: 24 mg/dL — ABNORMAL HIGH (ref 8–23)
CO2: 27 mmol/L (ref 22–32)
Calcium: 9.1 mg/dL (ref 8.9–10.3)
Chloride: 104 mmol/L (ref 98–111)
Creatinine, Ser: 0.68 mg/dL (ref 0.44–1.00)
GFR, Estimated: >60 mL/min (ref >60)
Glucose, Bld: 143 mg/dL — ABNORMAL HIGH (ref 70–99)
Potassium: 3.9 mmol/L (ref 3.5–5.1)
Sodium: 140 mmol/L (ref 135–145)

## 2022-07-01 LAB — TSH: TSH: 1.98 u[IU]/mL (ref 0.350–4.500)

## 2022-07-01 LAB — FERRITIN: Ferritin: 5 ng/mL — ABNORMAL LOW (ref 11–307)

## 2022-07-01 LAB — FOLATE: Folate: 17.7 ng/mL (ref >5.9)

## 2022-07-01 LAB — VITAMIN B12: Vitamin B-12: 187 pg/mL (ref 180–914)

## 2022-07-01 MED ORDER — ARIPIPRAZOLE 15 MG PO TABS
15.0000 mg | ORAL_TABLET | Freq: Every day | ORAL | 0 refills | Status: AC
Start: 2022-07-01 — End: 2022-09-29

## 2022-07-01 MED ORDER — MIRTAZAPINE 7.5 MG PO TABS: 7.5000 mg | ORAL_TABLET | Freq: Every day | ORAL | 2 refills | Status: AC

## 2022-07-01 MED ORDER — BUPROPION HCL ER (XL) 150 MG PO TB24: 150.0000 mg | ORAL_TABLET | Freq: Every day | ORAL | 0 refills | Status: DC

## 2022-07-01 NOTE — Telephone Encounter (Signed)
pt needs a rx for the abilify 15mg  pt rx has expired. pt seen today next appt on 07-31-22

## 2022-07-01 NOTE — Telephone Encounter (Signed)
pt was notified rx sent to pharmacy

## 2022-07-01 NOTE — Telephone Encounter (Signed)
Ordered

## 2022-07-02 DIAGNOSIS — R69 Illness, unspecified: Secondary | ICD-10-CM | POA: Diagnosis not present

## 2022-07-02 DIAGNOSIS — Z79899 Other long term (current) drug therapy: Secondary | ICD-10-CM | POA: Diagnosis not present

## 2022-07-02 DIAGNOSIS — J439 Emphysema, unspecified: Secondary | ICD-10-CM | POA: Diagnosis not present

## 2022-07-02 DIAGNOSIS — I10 Essential (primary) hypertension: Secondary | ICD-10-CM | POA: Diagnosis not present

## 2022-07-02 DIAGNOSIS — I110 Hypertensive heart disease with heart failure: Secondary | ICD-10-CM | POA: Diagnosis not present

## 2022-07-02 DIAGNOSIS — I509 Heart failure, unspecified: Secondary | ICD-10-CM | POA: Diagnosis not present

## 2022-07-02 DIAGNOSIS — Z7951 Long term (current) use of inhaled steroids: Secondary | ICD-10-CM | POA: Diagnosis not present

## 2022-07-02 DIAGNOSIS — Z792 Long term (current) use of antibiotics: Secondary | ICD-10-CM | POA: Diagnosis not present

## 2022-07-02 DIAGNOSIS — Z7982 Long term (current) use of aspirin: Secondary | ICD-10-CM | POA: Diagnosis not present

## 2022-07-02 DIAGNOSIS — Z7984 Long term (current) use of oral hypoglycemic drugs: Secondary | ICD-10-CM | POA: Diagnosis not present

## 2022-07-02 DIAGNOSIS — E785 Hyperlipidemia, unspecified: Secondary | ICD-10-CM | POA: Diagnosis not present

## 2022-07-02 DIAGNOSIS — Z87891 Personal history of nicotine dependence: Secondary | ICD-10-CM | POA: Diagnosis not present

## 2022-07-02 DIAGNOSIS — E119 Type 2 diabetes mellitus without complications: Secondary | ICD-10-CM | POA: Diagnosis not present

## 2022-07-02 NOTE — Progress Notes (Signed)
Spoke to patient she voiced understanding and stated that she would contact her PCP

## 2022-07-02 NOTE — Progress Notes (Signed)
Called to make patient aware of lab results no answer left voicemail for patient to return call to office

## 2022-07-02 NOTE — Progress Notes (Signed)
Please contact the patient regarding her low hemoglobin and ferritin levels, indicating anemia. Please advise her to follow up with her primary care provider for further assessment. Despite her current iron supplement regimen, additional treatment and evaluation may be necessary. Others labs are within acceptable range.

## 2022-07-03 DIAGNOSIS — R42 Dizziness and giddiness: Secondary | ICD-10-CM | POA: Diagnosis not present

## 2022-07-03 DIAGNOSIS — Z299 Encounter for prophylactic measures, unspecified: Secondary | ICD-10-CM | POA: Diagnosis not present

## 2022-07-03 DIAGNOSIS — R69 Illness, unspecified: Secondary | ICD-10-CM | POA: Diagnosis not present

## 2022-07-03 DIAGNOSIS — J441 Chronic obstructive pulmonary disease with (acute) exacerbation: Secondary | ICD-10-CM | POA: Diagnosis not present

## 2022-07-03 DIAGNOSIS — J983 Compensatory emphysema: Secondary | ICD-10-CM | POA: Diagnosis not present

## 2022-07-15 ENCOUNTER — Telehealth: Payer: Self-pay

## 2022-07-15 DIAGNOSIS — J449 Chronic obstructive pulmonary disease, unspecified: Secondary | ICD-10-CM | POA: Diagnosis not present

## 2022-07-15 NOTE — Telephone Encounter (Signed)
faxed and confirmed pcp abwork results faxed. Katherine Romero

## 2022-07-15 NOTE — Telephone Encounter (Signed)
pt was notified of labwork results and pt was told that a copy would be faxed to the primary care.

## 2022-07-16 DIAGNOSIS — I509 Heart failure, unspecified: Secondary | ICD-10-CM | POA: Diagnosis not present

## 2022-07-16 DIAGNOSIS — J449 Chronic obstructive pulmonary disease, unspecified: Secondary | ICD-10-CM | POA: Diagnosis not present

## 2022-07-17 DIAGNOSIS — I509 Heart failure, unspecified: Secondary | ICD-10-CM | POA: Diagnosis not present

## 2022-07-17 DIAGNOSIS — J449 Chronic obstructive pulmonary disease, unspecified: Secondary | ICD-10-CM | POA: Diagnosis not present

## 2022-07-17 DIAGNOSIS — Z299 Encounter for prophylactic measures, unspecified: Secondary | ICD-10-CM | POA: Diagnosis not present

## 2022-07-17 DIAGNOSIS — I25119 Atherosclerotic heart disease of native coronary artery with unspecified angina pectoris: Secondary | ICD-10-CM | POA: Diagnosis not present

## 2022-07-17 DIAGNOSIS — J9611 Chronic respiratory failure with hypoxia: Secondary | ICD-10-CM | POA: Diagnosis not present

## 2022-07-17 DIAGNOSIS — I10 Essential (primary) hypertension: Secondary | ICD-10-CM | POA: Diagnosis not present

## 2022-08-05 DIAGNOSIS — J449 Chronic obstructive pulmonary disease, unspecified: Secondary | ICD-10-CM | POA: Diagnosis not present

## 2022-08-07 DIAGNOSIS — Z299 Encounter for prophylactic measures, unspecified: Secondary | ICD-10-CM | POA: Diagnosis not present

## 2022-08-07 DIAGNOSIS — I25119 Atherosclerotic heart disease of native coronary artery with unspecified angina pectoris: Secondary | ICD-10-CM | POA: Diagnosis not present

## 2022-08-07 DIAGNOSIS — I509 Heart failure, unspecified: Secondary | ICD-10-CM | POA: Diagnosis not present

## 2022-08-07 DIAGNOSIS — Z7189 Other specified counseling: Secondary | ICD-10-CM | POA: Diagnosis not present

## 2022-08-07 DIAGNOSIS — Z0000 Encounter for general adult medical examination without abnormal findings: Secondary | ICD-10-CM | POA: Diagnosis not present

## 2022-08-07 DIAGNOSIS — I10 Essential (primary) hypertension: Secondary | ICD-10-CM | POA: Diagnosis not present

## 2022-08-07 DIAGNOSIS — Z1331 Encounter for screening for depression: Secondary | ICD-10-CM | POA: Diagnosis not present

## 2022-08-07 DIAGNOSIS — F17210 Nicotine dependence, cigarettes, uncomplicated: Secondary | ICD-10-CM | POA: Diagnosis not present

## 2022-08-07 DIAGNOSIS — Z1339 Encounter for screening examination for other mental health and behavioral disorders: Secondary | ICD-10-CM | POA: Diagnosis not present

## 2022-08-15 DIAGNOSIS — J449 Chronic obstructive pulmonary disease, unspecified: Secondary | ICD-10-CM | POA: Diagnosis not present

## 2022-08-15 DIAGNOSIS — I509 Heart failure, unspecified: Secondary | ICD-10-CM | POA: Diagnosis not present

## 2022-08-16 DIAGNOSIS — I509 Heart failure, unspecified: Secondary | ICD-10-CM | POA: Diagnosis not present

## 2022-08-16 DIAGNOSIS — J449 Chronic obstructive pulmonary disease, unspecified: Secondary | ICD-10-CM | POA: Diagnosis not present

## 2022-09-01 DIAGNOSIS — J449 Chronic obstructive pulmonary disease, unspecified: Secondary | ICD-10-CM | POA: Diagnosis not present

## 2022-09-05 DIAGNOSIS — I700 Atherosclerosis of aorta: Secondary | ICD-10-CM | POA: Diagnosis not present

## 2022-09-05 DIAGNOSIS — Z299 Encounter for prophylactic measures, unspecified: Secondary | ICD-10-CM | POA: Diagnosis not present

## 2022-09-05 DIAGNOSIS — J449 Chronic obstructive pulmonary disease, unspecified: Secondary | ICD-10-CM | POA: Diagnosis not present

## 2022-09-05 DIAGNOSIS — R42 Dizziness and giddiness: Secondary | ICD-10-CM | POA: Diagnosis not present

## 2022-09-05 DIAGNOSIS — I25119 Atherosclerotic heart disease of native coronary artery with unspecified angina pectoris: Secondary | ICD-10-CM | POA: Diagnosis not present

## 2022-09-05 DIAGNOSIS — I10 Essential (primary) hypertension: Secondary | ICD-10-CM | POA: Diagnosis not present

## 2022-09-15 DIAGNOSIS — J449 Chronic obstructive pulmonary disease, unspecified: Secondary | ICD-10-CM | POA: Diagnosis not present

## 2022-09-15 DIAGNOSIS — I509 Heart failure, unspecified: Secondary | ICD-10-CM | POA: Diagnosis not present

## 2022-09-16 DIAGNOSIS — I509 Heart failure, unspecified: Secondary | ICD-10-CM | POA: Diagnosis not present

## 2022-09-16 DIAGNOSIS — J449 Chronic obstructive pulmonary disease, unspecified: Secondary | ICD-10-CM | POA: Diagnosis not present

## 2022-09-23 DIAGNOSIS — J449 Chronic obstructive pulmonary disease, unspecified: Secondary | ICD-10-CM | POA: Diagnosis not present
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
## 2022-09-29 ENCOUNTER — Ambulatory Visit: Payer: Medicare HMO | Admitting: Psychiatry

## 2022-10-15 DIAGNOSIS — I509 Heart failure, unspecified: Secondary | ICD-10-CM | POA: Diagnosis not present

## 2022-10-15 DIAGNOSIS — J449 Chronic obstructive pulmonary disease, unspecified: Secondary | ICD-10-CM | POA: Diagnosis not present

## 2022-10-16 DIAGNOSIS — I509 Heart failure, unspecified: Secondary | ICD-10-CM | POA: Diagnosis not present

## 2022-10-16 DIAGNOSIS — J449 Chronic obstructive pulmonary disease, unspecified: Secondary | ICD-10-CM | POA: Diagnosis not present

## 2022-10-22 DIAGNOSIS — E1165 Type 2 diabetes mellitus with hyperglycemia: Secondary | ICD-10-CM | POA: Diagnosis not present

## 2022-10-22 DIAGNOSIS — R5383 Other fatigue: Secondary | ICD-10-CM | POA: Diagnosis not present

## 2022-10-22 DIAGNOSIS — E7800 Pure hypercholesterolemia, unspecified: Secondary | ICD-10-CM | POA: Diagnosis not present

## 2022-10-22 DIAGNOSIS — Z299 Encounter for prophylactic measures, unspecified: Secondary | ICD-10-CM | POA: Diagnosis not present

## 2022-10-22 DIAGNOSIS — I10 Essential (primary) hypertension: Secondary | ICD-10-CM | POA: Diagnosis not present

## 2022-10-22 DIAGNOSIS — Z79899 Other long term (current) drug therapy: Secondary | ICD-10-CM | POA: Diagnosis not present

## 2022-10-22 DIAGNOSIS — Z0000 Encounter for general adult medical examination without abnormal findings: Secondary | ICD-10-CM | POA: Diagnosis not present

## 2022-11-04 DIAGNOSIS — H353113 Nonexudative age-related macular degeneration, right eye, advanced atrophic without subfoveal involvement: Secondary | ICD-10-CM | POA: Diagnosis not present

## 2022-11-07 DIAGNOSIS — J449 Chronic obstructive pulmonary disease, unspecified: Secondary | ICD-10-CM | POA: Diagnosis not present
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
## 2022-11-15 DIAGNOSIS — J449 Chronic obstructive pulmonary disease, unspecified: Secondary | ICD-10-CM | POA: Diagnosis not present

## 2022-11-15 DIAGNOSIS — I509 Heart failure, unspecified: Secondary | ICD-10-CM | POA: Diagnosis not present

## 2022-11-16 DIAGNOSIS — J449 Chronic obstructive pulmonary disease, unspecified: Secondary | ICD-10-CM | POA: Diagnosis not present

## 2022-11-16 DIAGNOSIS — I509 Heart failure, unspecified: Secondary | ICD-10-CM | POA: Diagnosis not present

## 2022-11-17 ENCOUNTER — Ambulatory Visit (INDEPENDENT_AMBULATORY_CARE_PROVIDER_SITE_OTHER): Payer: Medicare HMO | Admitting: Psychiatry

## 2022-11-17 DIAGNOSIS — Z91199 Patient's noncompliance with other medical treatment and regimen due to unspecified reason: Secondary | ICD-10-CM

## 2022-11-27 DIAGNOSIS — Z299 Encounter for prophylactic measures, unspecified: Secondary | ICD-10-CM | POA: Diagnosis not present

## 2022-11-27 DIAGNOSIS — I10 Essential (primary) hypertension: Secondary | ICD-10-CM | POA: Diagnosis not present

## 2022-11-27 DIAGNOSIS — J441 Chronic obstructive pulmonary disease with (acute) exacerbation: Secondary | ICD-10-CM | POA: Diagnosis not present

## 2022-11-27 DIAGNOSIS — K219 Gastro-esophageal reflux disease without esophagitis: Secondary | ICD-10-CM | POA: Diagnosis not present

## 2022-11-27 DIAGNOSIS — H3530 Unspecified macular degeneration: Secondary | ICD-10-CM | POA: Diagnosis not present

## 2022-11-27 DIAGNOSIS — I509 Heart failure, unspecified: Secondary | ICD-10-CM | POA: Diagnosis not present

## 2022-12-03 DIAGNOSIS — Z299 Encounter for prophylactic measures, unspecified: Secondary | ICD-10-CM | POA: Diagnosis not present

## 2022-12-03 DIAGNOSIS — R058 Other specified cough: Secondary | ICD-10-CM | POA: Diagnosis not present

## 2022-12-03 DIAGNOSIS — J983 Compensatory emphysema: Secondary | ICD-10-CM | POA: Diagnosis not present

## 2022-12-03 DIAGNOSIS — I10 Essential (primary) hypertension: Secondary | ICD-10-CM | POA: Diagnosis not present

## 2022-12-03 DIAGNOSIS — J309 Allergic rhinitis, unspecified: Secondary | ICD-10-CM | POA: Diagnosis not present

## 2022-12-03 DIAGNOSIS — J9611 Chronic respiratory failure with hypoxia: Secondary | ICD-10-CM | POA: Diagnosis not present

## 2022-12-10 DIAGNOSIS — J441 Chronic obstructive pulmonary disease with (acute) exacerbation: Secondary | ICD-10-CM | POA: Diagnosis not present

## 2022-12-10 DIAGNOSIS — F22 Delusional disorders: Secondary | ICD-10-CM | POA: Diagnosis not present

## 2022-12-10 DIAGNOSIS — I10 Essential (primary) hypertension: Secondary | ICD-10-CM | POA: Diagnosis not present

## 2022-12-10 DIAGNOSIS — Z299 Encounter for prophylactic measures, unspecified: Secondary | ICD-10-CM | POA: Diagnosis not present

## 2022-12-10 DIAGNOSIS — E261 Secondary hyperaldosteronism: Secondary | ICD-10-CM | POA: Diagnosis not present

## 2022-12-16 DIAGNOSIS — J449 Chronic obstructive pulmonary disease, unspecified: Secondary | ICD-10-CM | POA: Diagnosis not present

## 2022-12-16 DIAGNOSIS — I509 Heart failure, unspecified: Secondary | ICD-10-CM | POA: Diagnosis not present

## 2022-12-17 DIAGNOSIS — I509 Heart failure, unspecified: Secondary | ICD-10-CM | POA: Diagnosis not present

## 2022-12-17 DIAGNOSIS — J449 Chronic obstructive pulmonary disease, unspecified: Secondary | ICD-10-CM | POA: Diagnosis not present

## 2022-12-18 DIAGNOSIS — J449 Chronic obstructive pulmonary disease, unspecified: Secondary | ICD-10-CM | POA: Diagnosis not present

## 2023-01-07 DIAGNOSIS — J449 Chronic obstructive pulmonary disease, unspecified: Secondary | ICD-10-CM | POA: Diagnosis not present

## 2023-01-15 DIAGNOSIS — I509 Heart failure, unspecified: Secondary | ICD-10-CM | POA: Diagnosis not present

## 2023-01-15 DIAGNOSIS — J449 Chronic obstructive pulmonary disease, unspecified: Secondary | ICD-10-CM | POA: Diagnosis not present

## 2023-01-16 DIAGNOSIS — I509 Heart failure, unspecified: Secondary | ICD-10-CM | POA: Diagnosis not present

## 2023-01-16 DIAGNOSIS — J449 Chronic obstructive pulmonary disease, unspecified: Secondary | ICD-10-CM | POA: Diagnosis not present

## 2023-02-05 DIAGNOSIS — J449 Chronic obstructive pulmonary disease, unspecified: Secondary | ICD-10-CM | POA: Diagnosis not present

## 2023-02-15 DIAGNOSIS — I509 Heart failure, unspecified: Secondary | ICD-10-CM | POA: Diagnosis not present

## 2023-02-15 DIAGNOSIS — J449 Chronic obstructive pulmonary disease, unspecified: Secondary | ICD-10-CM | POA: Diagnosis not present

## 2023-02-16 DIAGNOSIS — J449 Chronic obstructive pulmonary disease, unspecified: Secondary | ICD-10-CM | POA: Diagnosis not present

## 2023-02-16 DIAGNOSIS — I509 Heart failure, unspecified: Secondary | ICD-10-CM | POA: Diagnosis not present

## 2023-03-06 DIAGNOSIS — J449 Chronic obstructive pulmonary disease, unspecified: Secondary | ICD-10-CM | POA: Diagnosis not present

## 2023-03-08 DIAGNOSIS — E785 Hyperlipidemia, unspecified: Secondary | ICD-10-CM | POA: Diagnosis not present

## 2023-03-08 DIAGNOSIS — J449 Chronic obstructive pulmonary disease, unspecified: Secondary | ICD-10-CM | POA: Diagnosis not present

## 2023-03-08 DIAGNOSIS — Z8709 Personal history of other diseases of the respiratory system: Secondary | ICD-10-CM | POA: Diagnosis not present

## 2023-03-08 DIAGNOSIS — E119 Type 2 diabetes mellitus without complications: Secondary | ICD-10-CM | POA: Diagnosis not present

## 2023-03-08 DIAGNOSIS — Z9981 Dependence on supplemental oxygen: Secondary | ICD-10-CM | POA: Diagnosis not present

## 2023-03-08 DIAGNOSIS — R079 Chest pain, unspecified: Secondary | ICD-10-CM | POA: Diagnosis not present

## 2023-03-08 DIAGNOSIS — R0789 Other chest pain: Secondary | ICD-10-CM | POA: Diagnosis not present

## 2023-03-08 DIAGNOSIS — I110 Hypertensive heart disease with heart failure: Secondary | ICD-10-CM | POA: Diagnosis not present

## 2023-03-08 DIAGNOSIS — I509 Heart failure, unspecified: Secondary | ICD-10-CM | POA: Diagnosis not present

## 2023-03-08 DIAGNOSIS — R9431 Abnormal electrocardiogram [ECG] [EKG]: Secondary | ICD-10-CM | POA: Diagnosis not present

## 2023-03-08 DIAGNOSIS — Z87891 Personal history of nicotine dependence: Secondary | ICD-10-CM | POA: Diagnosis not present

## 2023-03-16 DIAGNOSIS — R079 Chest pain, unspecified: Secondary | ICD-10-CM | POA: Diagnosis not present

## 2023-03-17 DIAGNOSIS — I509 Heart failure, unspecified: Secondary | ICD-10-CM | POA: Diagnosis not present

## 2023-03-17 DIAGNOSIS — J449 Chronic obstructive pulmonary disease, unspecified: Secondary | ICD-10-CM | POA: Diagnosis not present

## 2023-03-18 DIAGNOSIS — J449 Chronic obstructive pulmonary disease, unspecified: Secondary | ICD-10-CM | POA: Diagnosis not present

## 2023-03-18 DIAGNOSIS — I509 Heart failure, unspecified: Secondary | ICD-10-CM | POA: Diagnosis not present

## 2023-04-06 ENCOUNTER — Other Ambulatory Visit: Payer: Self-pay

## 2023-04-06 DIAGNOSIS — J441 Chronic obstructive pulmonary disease with (acute) exacerbation: Secondary | ICD-10-CM

## 2023-04-07 DIAGNOSIS — H353114 Nonexudative age-related macular degeneration, right eye, advanced atrophic with subfoveal involvement: Secondary | ICD-10-CM | POA: Diagnosis not present

## 2023-04-08 ENCOUNTER — Telehealth: Payer: Self-pay | Admitting: *Deleted

## 2023-04-08 NOTE — Progress Notes (Signed)
 Complex Care Management Note Care Guide Note  04/08/2023 Name: Katherine Romero MRN: [id number] DOB: Aug 15, 1950   Complex Care Management Outreach Attempts: An unsuccessful telephone outreach was attempted today to offer the patient information about available complex care management services.  Follow Up Plan:  Additional outreach attempts will be made to offer the patient complex care management information and services.   Encounter Outcome:  No Answer  Harlene Satterfield  Care Coordination Care Guide  Direct Dial: [phone]

## 2023-04-08 NOTE — Progress Notes (Signed)
 Complex Care Management Note  Care Guide Note 04/08/2023 Name: Katherine Romero MRN: [id number] DOB: January 13, 1951  Katherine Romero is a 73 y.o. year old female who sees Maree Isles, MD for primary care. I reached out to [name] by phone today to offer complex care management services.  [name] was given information about Complex Care Management services today including:   The Complex Care Management services include support from the care team which includes your Nurse Coordinator, Clinical Social Worker, or Pharmacist.  The Complex Care Management team is here to help remove barriers to the health concerns and goals most important to you. Complex Care Management services are voluntary, and the patient may decline or stop services at any time by request to their care team member.   Complex Care Management Consent Status: Patient agreed to services and verbal consent obtained.   Follow up plan:  Telephone appointment with complex care management team member scheduled for:  04/10/23  Encounter Outcome:  Patient Scheduled  [facility] Coordination Care Guide  Direct Dial: [phone]

## 2023-04-10 ENCOUNTER — Encounter: Payer: Self-pay | Admitting: *Deleted

## 2023-04-10 NOTE — Patient Outreach (Signed)
 Erroneous Encounter

## 2023-04-14 NOTE — Progress Notes (Signed)
[facility] - Clinic Note  04/20/2023   CHIEF COMPLAINT Patient presents for Retina Evaluation  HISTORY OF PRESENT ILLNESS: Katherine Romero is a 73 y.o. female who presents to the clinic today for:  HPI     Retina Evaluation   In both eyes.  I, the attending physician,  performed the HPI with the patient and updated documentation appropriately.        Comments   Patient here for Retina Evaluation. Referred by Dr Earlene Plater. Patient states vision very poor. Can't see TV. Has to listen to it. Can't see small things. If don't move can't see. Hard to see phone. Can't see text messages. Has a magnifying glass. It doesn't help. Seem to be darker everyday. No eye pain. Not using drops.       Last edited by Rennis Chris, MD on 04/20/2023 11:28 PM.     Patient states the vision is poor.   Referring physician: Desiree Lucy, OD [address] [place]. [address] [place],  [place] [address]  HISTORICAL INFORMATION:  Selected notes from the MEDICAL RECORD NUMBER Referred by Dr. Desiree Lucy for macular degeneration LEE:  Ocular Hx- PMH-   CURRENT MEDICATIONS: No current outpatient medications on file. (Ophthalmic Drugs)   No current facility-administered medications for this visit. (Ophthalmic Drugs)   Current Outpatient Medications (Other)  Medication Sig   albuterol (PROVENTIL) (2.5 MG/3ML) 0.083% nebulizer solution INHALE ONE VIAL IN NEBULIZER EVERY 6 HOURS AS NEEDED FOR WHEEZING-SHORTNESS OF BREATH   Albuterol Sulfate (PROAIR RESPICLICK) 108 (90 Base) MCG/ACT AEPB Inhale 2 puffs into the lungs every 6 (six) hours as needed (for wheezing/shortness of breath).    amLODipine (NORVASC) 10 MG tablet Take 1 tablet by mouth daily.   Aspirin-Salicylamide-Caffeine (BC HEADACHE POWDER PO) Take 1 Package by mouth every 4 (four) hours as needed (PAIN).   budesonide-formoterol (SYMBICORT) 160-4.5 MCG/ACT inhaler Inhale 2 puffs into the lungs 2 (two) times daily.   ENSURE PLUS (ENSURE  PLUS) LIQD Take 237 mLs by mouth 2 (two) times daily between meals.   FEROSUL 325 (65 Fe) MG tablet Take 325 mg by mouth daily.   furosemide (LASIX) 20 MG tablet Take 20 mg by mouth 2 (two) times daily.   gemfibrozil (LOPID) 600 MG tablet Take 600 mg by mouth 2 (two) times daily.   JARDIANCE 10 MG TABS tablet Take 25 mg by mouth daily.   metFORMIN (GLUCOPHAGE) 500 MG tablet Take 1 tablet by mouth daily.   metoprolol (LOPRESSOR) 100 MG tablet Take 100 mg by mouth 2 (two) times daily.   metoprolol succinate (TOPROL-XL) 25 MG 24 hr tablet Take 25 mg by mouth 2 (two) times daily.   montelukast (SINGULAIR) 10 MG tablet Take 10 mg by mouth at bedtime.   pantoprazole (PROTONIX) 40 MG tablet Take 40 mg by mouth daily.   sertraline (ZOLOFT) 50 MG tablet Take 50 mg by mouth daily.   ARIPiprazole (ABILIFY) 15 MG tablet Take 1 tablet (15 mg total) by mouth daily.   aspirin 81 MG EC tablet Take by mouth. (Patient not taking: Reported on 04/20/2023)   atorvastatin (LIPITOR) 40 MG tablet Take 40 mg by mouth daily.   hydrochlorothiazide (HYDRODIURIL) 25 MG tablet Take 1 tablet by mouth daily. (Patient not taking: Reported on 04/20/2023)   mirtazapine (REMERON) 7.5 MG tablet Take 1 tablet (7.5 mg total) by mouth at bedtime.   sertraline (ZOLOFT) 100 MG tablet Take 1.5 tablets (150 mg total) by mouth daily.  traZODone (DESYREL) 50 MG tablet Take 0.5-1 tablets (25-50 mg total) by mouth at bedtime as needed for sleep.   No current facility-administered medications for this visit. (Other)   REVIEW OF SYSTEMS: ROS   Positive for: Endocrine, Eyes, Respiratory Last edited by Laddie Aquas, COA on 04/20/2023  2:13 PM.     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 Jayme Cloud, [facility]   Arthritis    Atypical chest pain  09/25/2015   Chronic diastolic (congestive) heart failure 08/29/2020   COPD (chronic obstructive pulmonary disease) 09/25/2015   Elliptocytosis 07/19/2015   Essential (primary) hypertension 11/04/2018   Generalized anxiety disorder 09/25/2015   GERD (gastroesophageal reflux disease)    Heme positive stool 09/14/2015   History of COVID-19 08/25/2020   Hypercholesteremia    Hyperlipidemia    Hypokalemia 08/26/2020   Iron deficiency anemia 08/10/2015   Major depressive disorder 10/14/2017   Nonrheumatic aortic valve stenosis 04/28/2019   Pneumonia due to COVID-19 virus 08/25/2020   PTSD (post-traumatic stress disorder)    Shortness of breath 11/04/2018   Tobacco use 07/19/2015   Type II diabetes mellitus    Past Surgical History:  Procedure Laterality Date   CARPAL TUNNEL RELEASE Left    CATARACT EXTRACTION W/PHACO Left 06/08/2017   Procedure: CATARACT EXTRACTION PHACO AND INTRAOCULAR LENS PLACEMENT (IOC);  Surgeon: Gemma Payor, MD;  Location: [facility];  Service: Ophthalmology;  Laterality: Left;  CDE: 5.62   CATARACT EXTRACTION W/PHACO Right 06/22/2017   Procedure: CATARACT EXTRACTION WITH PHACOEMULSIFICATION  AND INTRAOCULAR LENS PLACEMENT RIGHT EYE;  Surgeon: Gemma Payor, MD;  Location: [facility];  Service: Ophthalmology;  Laterality: Right;  CDE: 5.62   KNEE SURGERY Right    ULNAR NERVE TRANSPOSITION Left    WISDOM TOOTH EXTRACTION     FAMILY HISTORY Family History  Problem Relation Age of Onset   Depression Mother        ECT; numerous hospitalizations   Anemia Son    Suicidality Cousin    SOCIAL HISTORY Social History   Tobacco Use   Smoking status: Every Day    Current packs/day: 2.00    Average packs/day: 2.0 packs/day for 50.0 years (100.0 ttl pk-yrs)    Types: Cigarettes   Smokeless tobacco: Never  Vaping Use   Vaping status: Never Used  Substance Use Topics   Alcohol use: No   Drug use: No       OPHTHALMIC EXAM:  Base Eye Exam     Visual Acuity (Snellen -  Linear)       Right Left   Dist cc CF at 2' 20/200 -2   Dist ph cc NI 20/60 +2    Correction: Glasses         Tonometry (Tonopen, 2:07 PM)       Right Left   Pressure 14 15         Pupils       Dark Light Shape React APD   Right 3 2 Round Brisk None   Left 3 2 Round Brisk None         Visual Fields (Counting fingers)       Left Right    Full Full         Extraocular Movement       Right Left    Full,  Ortho Full, Ortho         Neuro/Psych     Oriented x3: Yes   Mood/Affect: Normal         Dilation     Both eyes: 1.0% Mydriacyl, 2.5% Phenylephrine @ 2:07 PM           Slit Lamp and Fundus Exam     External Exam       Right Left   External Normal Normal         Slit Lamp Exam       Right Left   Lids/Lashes Dermatochalasis - upper lid, Meibomian gland dysfunction Dermatochalasis - upper lid, Meibomian gland dysfunction   Conjunctiva/Sclera White and quiet White and quiet   Cornea Arcus, Well healed temporal cataract wound, 1+ inferior Punctate epithelial erosions Arcus, Well healed temporal cataract wound, 1+ Punctate epithelial erosions   Anterior Chamber Deep and clear Deep and clear   Iris Round and dilated, No NVI Round and dilated, No NVI   Lens PC IOL in good postion with open PC PC IOL in good postion with open PC   Anterior Vitreous Vitreous syneresis Vitreous syneresis         Fundus Exam       Right Left   Disc Temporal PPA, Sharp rim, mild Pallor Temporal PPA, Sharp rim, mild Pallor   C/D Ratio 0.3 0.5   Macula Flat, Blunted foveal reflex, central GA w/ pigment clumping, +drusen, no heme or edema Flat, Blunted foveal reflex, perifoveal GA with pigment clumping, no heme or edema   Vessels Vascular attenuation, Tortuous Vascular attenuation, Tortuous, AV crossing changes   Periphery Attached, No heme Attached, No heme           Refraction     Wearing Rx       Sphere Cylinder Axis Add   Right +1.00 +0.50 163  +2.75   Left +1.25 +0.25 035 +2.75         Manifest Refraction   Unable to correct any better pt states looks same           IMAGING AND PROCEDURES  Imaging and Procedures for 04/20/2023  OCT, Retina - OU - Both Eyes       Right Eye Central Foveal Thickness: 184. Progression has no prior data. Findings include normal foveal contour, no IRF, no SRF, retinal drusen , outer retinal atrophy, vitreomacular adhesion (Non Ex. AMD with central ORA/GA).   Left Eye Central Foveal Thickness: 216. Progression has no prior data. Findings include no IRF, no SRF, abnormal foveal contour (Non Ex. AMD with patchy central ORA/GA, partial PVD).   Notes *Images captured and stored on drive  Diagnosis / Impression:  Nonexudative ARMD w/ geographic atrophy OU OD: central ORA/GA OS: patchy central ORA/GA, partial PVD  Clinical management:  See below  Abbreviations: NFP - Normal foveal profile. CME - cystoid macular edema. PED - pigment epithelial detachment. IRF - intraretinal fluid. SRF - subretinal fluid. EZ - ellipsoid zone. ERM - epiretinal membrane. ORA - outer retinal atrophy. ORT - outer retinal tubulation. SRHM - subretinal hyper-reflective material. IRHM - intraretinal hyper-reflective material           ASSESSMENT/PLAN:   ICD-10-CM   1. Nonexudative age-related macular degeneration, bilateral, advanced atrophic with subfoveal involvement  H35.3134 OCT, Retina - OU - Both Eyes    2. Diabetes mellitus without complication (HCC)  E11.9     3. Diabetes mellitus treated with oral medication (HCC)  E11.9  Z79.84     4. Essential (primary) hypertension  I10     5. Hypertensive retinopathy of both eyes  H35.033     6. Pseudophakia  Z96.1      1. Age related macular degeneration, non-exudative, OU  - advanced stage w/ central GA OU  - The incidence, anatomy, and pathology of dry AMD, risk of progression, and the AREDS and AREDS 2 studies including smoking risks discussed with  patient. - Heavy current every day smoke (smokes 1-2 packs a day w/ no interest in quitting) - oxygen dependent  - OCT shows OD: central ORA/GA, ZO:XWRUEA central ORA/GA, partial PVD - BCVA OD CF, OS 20/60 - Recommend amsler grid monitoring - no retinal or ophthalmic interventions indicated or recommended   - f/u 3-4 months, DFE, OCT   2,3. Diabetes mellitus, type 2 without retinopathy - The incidence, risk factors for progression, natural history and treatment options for diabetic retinopathy  were discussed with patient.   - The need for close monitoring of blood glucose, blood pressure, and serum lipids, avoiding cigarette or any type of tobacco, and the need for long term follow up was also discussed with patient. - f/u in 1 year, sooner prn   4,5. Hypertensive retinopathy OU - discussed importance of tight BP control - monitor   6. Pseudophakia OU  - s/p CE/IOL OU w/ Dr. Alto Denver (March 2019)  - IOL in good position, doing well  - monitor   Ophthalmic Meds Ordered this visit:  No orders of the defined types were placed in this encounter.    Return in about 4 months (around 08/18/2023) for f/u nonexudative ARMD w/ GA OU - DFE, OCT.  There are no Patient Instructions on file for this visit.  Explained the diagnoses, plan, and follow up with the patient and they expressed understanding.  Patient expressed understanding of the importance of proper follow up care.   This document serves as a record of services personally performed by Karie Chimera, MD, PhD. It was created on their behalf by Glee Arvin. Manson Passey, OA an ophthalmic technician. The creation of this record is the provider's dictation and/or activities during the visit.    Electronically signed by: Glee Arvin. Manson Passey, OA 04/20/23 11:38 PM  This document serves as a record of services personally performed by Karie Chimera, MD, PhD. It was created on their behalf by Charlette Caffey, COT an ophthalmic technician. The creation  of this record is the provider's dictation and/or activities during the visit.    Electronically signed by:  Charlette Caffey, COT  04/20/23 11:38 PM  Karie Chimera, M.D., Ph.D. Diseases & Surgery of the Retina and Vitreous [facility] 04/20/2023  I have reviewed the above documentation for accuracy and completeness, and I agree with the above. Karie Chimera, M.D., Ph.D. 04/20/23 11:44 PM   Abbreviations: M myopia (nearsighted); A astigmatism; H hyperopia (farsighted); P presbyopia; Mrx spectacle prescription;  CTL contact lenses; OD right eye; OS left eye; OU both eyes  XT exotropia; ET esotropia; PEK punctate epithelial keratitis; PEE punctate epithelial erosions; DES dry eye syndrome; MGD meibomian gland dysfunction; ATs artificial tears; PFAT's preservative free artificial tears; NSC nuclear sclerotic cataract; PSC posterior subcapsular cataract; ERM epi-retinal membrane; PVD posterior vitreous detachment; RD retinal detachment; DM diabetes mellitus; DR diabetic retinopathy; NPDR non-proliferative diabetic retinopathy; PDR proliferative diabetic retinopathy; CSME clinically significant macular edema; DME diabetic macular edema; dbh dot blot hemorrhages; CWS cotton wool spot;  POAG primary open angle glaucoma; C/D cup-to-disc ratio; HVF humphrey visual field; GVF goldmann visual field; OCT optical coherence tomography; IOP intraocular pressure; BRVO Branch retinal vein occlusion; CRVO central retinal vein occlusion; CRAO central retinal artery occlusion; BRAO branch retinal artery occlusion; RT retinal tear; SB scleral buckle; PPV pars plana vitrectomy; VH Vitreous hemorrhage; PRP panretinal laser photocoagulation; IVK intravitreal kenalog; VMT vitreomacular traction; MH Macular hole;  NVD neovascularization of the disc; NVE neovascularization elsewhere; AREDS age related eye disease study; ARMD age related macular degeneration; POAG primary open angle glaucoma; EBMD  epithelial/anterior basement membrane dystrophy; ACIOL anterior chamber intraocular lens; IOL intraocular lens; PCIOL posterior chamber intraocular lens; Phaco/IOL phacoemulsification with intraocular lens placement; PRK photorefractive keratectomy; LASIK laser assisted in situ keratomileusis; HTN hypertension; DM diabetes mellitus; COPD chronic obstructive pulmonary disease

## 2023-04-17 ENCOUNTER — Ambulatory Visit: Payer: Self-pay | Admitting: *Deleted

## 2023-04-17 DIAGNOSIS — J449 Chronic obstructive pulmonary disease, unspecified: Secondary | ICD-10-CM | POA: Diagnosis not present

## 2023-04-17 DIAGNOSIS — I509 Heart failure, unspecified: Secondary | ICD-10-CM | POA: Diagnosis not present

## 2023-04-18 DIAGNOSIS — I509 Heart failure, unspecified: Secondary | ICD-10-CM | POA: Diagnosis not present

## 2023-04-18 DIAGNOSIS — J449 Chronic obstructive pulmonary disease, unspecified: Secondary | ICD-10-CM | POA: Diagnosis not present

## 2023-04-19 ENCOUNTER — Encounter: Payer: Self-pay | Admitting: *Deleted

## 2023-04-20 ENCOUNTER — Encounter (INDEPENDENT_AMBULATORY_CARE_PROVIDER_SITE_OTHER): Payer: Self-pay | Admitting: Ophthalmology

## 2023-04-20 ENCOUNTER — Ambulatory Visit (INDEPENDENT_AMBULATORY_CARE_PROVIDER_SITE_OTHER): Payer: Medicare HMO | Admitting: Ophthalmology

## 2023-04-20 DIAGNOSIS — Z961 Presence of intraocular lens: Secondary | ICD-10-CM | POA: Diagnosis not present

## 2023-04-20 DIAGNOSIS — E119 Type 2 diabetes mellitus without complications: Secondary | ICD-10-CM

## 2023-04-20 DIAGNOSIS — I10 Essential (primary) hypertension: Secondary | ICD-10-CM | POA: Diagnosis not present

## 2023-04-20 DIAGNOSIS — Z7984 Long term (current) use of oral hypoglycemic drugs: Secondary | ICD-10-CM

## 2023-04-20 DIAGNOSIS — H353134 Nonexudative age-related macular degeneration, bilateral, advanced atrophic with subfoveal involvement: Secondary | ICD-10-CM | POA: Diagnosis not present

## 2023-04-20 DIAGNOSIS — H35033 Hypertensive retinopathy, bilateral: Secondary | ICD-10-CM

## 2023-04-20 DIAGNOSIS — H3581 Retinal edema: Secondary | ICD-10-CM

## 2023-05-01 ENCOUNTER — Encounter: Payer: Self-pay | Admitting: *Deleted

## 2023-05-01 ENCOUNTER — Ambulatory Visit: Payer: Self-pay | Admitting: *Deleted

## 2023-05-01 NOTE — Patient Outreach (Signed)
  Care Coordination   Follow Up Visit Note   05/01/2023 Name: Katherine Romero MRN: [id number] DOB: [birth year]  Katherine Romero is a 73 y.o. year old female who sees Katherine Peri, MD for primary care. I spoke with  Katherine Romero by phone today.  What matters to the patients health and wellness today?  Being able to afford medications   Goals Addressed             This Visit's Progress    Care Management       Care Management Goals: Patient will complete application for Medicare Extra Help for Prescription Drugs Patient will reach out to RN Care manager at [phone] with any resource or care managemetn needs        SDOH assessments and interventions completed:  Yes    SDOH Interventions Today    Flowsheet Row Most Recent Value  SDOH Interventions   Transportation Interventions Patient Resources (Friends/Family)  Financial Strain Interventions Other (Comment)  [difficulty paying for namebrand prescription medications. Working with Medical illustrator to fill out application for [employer] Extra Help.]       Care Coordination Interventions:  Yes, provided  Interventions Today    Flowsheet Row Most Recent Value  Chronic Disease   Chronic disease during today's visit Diabetes  General Interventions   General Interventions Discussed/Reviewed General Interventions Discussed, General Interventions Reviewed, Doctor Visits  Doctor Visits Discussed/Reviewed Doctor Visits Discussed, Doctor Visits Reviewed, Specialist  [discussed visit with opthalmologist. No change in treatment for macular degeneration.]  Durable Medical Equipment (DME) Oxygen, Glucomoter, Shower bench, Walker  PCP/Specialist Visits Compliance with follow-up visit  [Follow up with opthalmologist in 4 months as recommended]  Education Interventions   Education Provided Provided Education  [Patient received printed education on how to assemble tub bench. Discussed application for Medicare Extra Help for  Prescription Drugs. Confirmed that she received the instructions on how to apply. She will ask sister to assist with application.]  Provided Verbal Education On Blood Sugar Monitoring, When to see the doctor, Medication, Eye Care, Mental Health/Coping with Illness, Applications  Mental Health Interventions   Mental Health Discussed/Reviewed Mental Health Discussed, Mental Health Reviewed  [discussed patient's outlook on progression of macular degeneration and currently having very low vision. Patient doesn't feel there's anything that can be done. Encouraged to talk with LCSW if she would like counseling.]  Pharmacy Interventions   Pharmacy Dicussed/Reviewed Pharmacy Topics Discussed, Pharmacy Topics Reviewed, Affording Medications, Medications and their functions  [Did not pickup Trelegy inhaler at [facility]. Cost was over $600.]  Safety Interventions   Safety Discussed/Reviewed Safety Discussed, Safety Reviewed, Fall Risk, Home Safety  Home Safety Assistive Devices       Follow up plan: Follow up call scheduled for 05/08/23    Encounter Outcome:  Patient Visit Completed   Demetrios Loll, RN, BSN [place]  [facility], [facility] Health RN Care Manager Direct Dial: [phone]

## 2023-05-08 ENCOUNTER — Ambulatory Visit: Payer: Self-pay | Admitting: *Deleted

## 2023-05-08 ENCOUNTER — Encounter: Payer: Self-pay | Admitting: *Deleted

## 2023-05-08 NOTE — Patient Outreach (Signed)
  Care Coordination   Follow Up Visit Note   05/08/2023 Name: VIOLETTA LAVALLE MRN: [id number] DOB: [birth year]  HILLARI ZUMWALT is a 73 y.o. year old female who sees Maree Isles, MD for primary care. I spoke with  [name] by phone today.  What matters to the patients health and wellness today?  Complete application for Medicare Extra Help    Goals Addressed             This Visit's Progress    Care Management   On track    Care Management Goals: Patient/son will complete application for Medicare Extra Help for Prescription Drugs online Patient will reach out to RN Care manager at [phone] with any resource or care managemetn needs        SDOH assessments and interventions completed:  Yes SDOH Interventions Today    Flowsheet Row Most Recent Value  SDOH Interventions   Financial Strain Interventions Other (Comment)  [difficulty paying for namebrand prescription medications. Working with Medical Illustrator to fill out application for [employer] Extra Help.]       Care Coordination Interventions:  Yes, provided  Interventions Today    Flowsheet Row Most Recent Value  Chronic Disease   Chronic disease during today's visit Diabetes  General Interventions   General Interventions Discussed/Reviewed General Interventions Discussed, General Interventions Reviewed  Education Interventions   Education Provided Provided Education  Provided Verbal Education On Medication, Insurance Plans, Other  [Medicare Extra Help for Prescription Drugs]  Pharmacy Interventions   Pharmacy Dicussed/Reviewed Pharmacy Topics Discussed, Pharmacy Topics Reviewed, Medications and their functions, Affording Medications  [Assisted with completing the initial portion of online application for Medicare Extra Help. Emailed reentry number [id number] and website link to son at [id number] per patient's request. They will complete the financial portion.]       Follow up plan: Follow up call  scheduled for 05/22/23    Encounter Outcome:  Patient Visit Completed   Josette Pellet, RN, BSN [place]  [facility], [facility] RN Care Manager Direct Dial: [phone]

## 2023-05-18 DIAGNOSIS — J449 Chronic obstructive pulmonary disease, unspecified: Secondary | ICD-10-CM | POA: Diagnosis not present

## 2023-05-18 DIAGNOSIS — I509 Heart failure, unspecified: Secondary | ICD-10-CM | POA: Diagnosis not present

## 2023-05-22 ENCOUNTER — Ambulatory Visit: Payer: Self-pay | Admitting: *Deleted

## 2023-05-22 NOTE — Patient Outreach (Signed)
Care Coordination   05/22/2023 Name: Katherine Romero MRN: [id number] DOB: 03-31-1951   Care Coordination Outreach Attempts:  An unsuccessful outreach was attempted for an appointment today.  Follow Up Plan:  Additional outreach attempts will be made to offer the patient complex care management information and services.   Encounter Outcome:  No Answer. Left HIPAA compliant VM.   Care Coordination Interventions:  No, not indicated    Demetrios Loll, RN, BSN [place]  [facility], [facility] RN Care Manager Direct Dial: [phone]

## 2023-05-25 DIAGNOSIS — J449 Chronic obstructive pulmonary disease, unspecified: Secondary | ICD-10-CM | POA: Diagnosis not present

## 2023-05-27 DIAGNOSIS — R062 Wheezing: Secondary | ICD-10-CM | POA: Diagnosis not present

## 2023-05-27 DIAGNOSIS — J9611 Chronic respiratory failure with hypoxia: Secondary | ICD-10-CM | POA: Diagnosis not present

## 2023-05-27 DIAGNOSIS — J439 Emphysema, unspecified: Secondary | ICD-10-CM | POA: Diagnosis not present

## 2023-05-27 DIAGNOSIS — I110 Hypertensive heart disease with heart failure: Secondary | ICD-10-CM | POA: Diagnosis not present

## 2023-05-27 DIAGNOSIS — I5032 Chronic diastolic (congestive) heart failure: Secondary | ICD-10-CM | POA: Diagnosis not present

## 2023-05-27 DIAGNOSIS — E785 Hyperlipidemia, unspecified: Secondary | ICD-10-CM | POA: Diagnosis not present

## 2023-05-27 DIAGNOSIS — Z20822 Contact with and (suspected) exposure to covid-19: Secondary | ICD-10-CM | POA: Diagnosis not present

## 2023-05-27 DIAGNOSIS — Z743 Need for continuous supervision: Secondary | ICD-10-CM | POA: Diagnosis not present

## 2023-05-27 DIAGNOSIS — I10 Essential (primary) hypertension: Secondary | ICD-10-CM | POA: Diagnosis not present

## 2023-05-27 DIAGNOSIS — E119 Type 2 diabetes mellitus without complications: Secondary | ICD-10-CM | POA: Diagnosis not present

## 2023-05-27 DIAGNOSIS — Z792 Long term (current) use of antibiotics: Secondary | ICD-10-CM | POA: Diagnosis not present

## 2023-05-27 DIAGNOSIS — Z79899 Other long term (current) drug therapy: Secondary | ICD-10-CM | POA: Diagnosis not present

## 2023-05-27 DIAGNOSIS — Z7984 Long term (current) use of oral hypoglycemic drugs: Secondary | ICD-10-CM | POA: Diagnosis not present

## 2023-05-27 DIAGNOSIS — Z7982 Long term (current) use of aspirin: Secondary | ICD-10-CM | POA: Diagnosis not present

## 2023-05-27 DIAGNOSIS — B349 Viral infection, unspecified: Secondary | ICD-10-CM | POA: Diagnosis not present

## 2023-05-27 DIAGNOSIS — I959 Hypotension, unspecified: Secondary | ICD-10-CM | POA: Diagnosis not present

## 2023-05-27 DIAGNOSIS — E1169 Type 2 diabetes mellitus with other specified complication: Secondary | ICD-10-CM | POA: Diagnosis not present

## 2023-05-27 DIAGNOSIS — F319 Bipolar disorder, unspecified: Secondary | ICD-10-CM | POA: Diagnosis not present

## 2023-05-27 DIAGNOSIS — R197 Diarrhea, unspecified: Secondary | ICD-10-CM | POA: Diagnosis not present

## 2023-05-27 DIAGNOSIS — E860 Dehydration: Secondary | ICD-10-CM | POA: Diagnosis not present

## 2023-05-27 DIAGNOSIS — R0602 Shortness of breath: Secondary | ICD-10-CM | POA: Diagnosis not present

## 2023-05-27 DIAGNOSIS — Z720 Tobacco use: Secondary | ICD-10-CM | POA: Diagnosis not present

## 2023-05-27 DIAGNOSIS — J441 Chronic obstructive pulmonary disease with (acute) exacerbation: Secondary | ICD-10-CM | POA: Diagnosis not present

## 2023-05-27 DIAGNOSIS — Z66 Do not resuscitate: Secondary | ICD-10-CM | POA: Diagnosis not present

## 2023-05-27 DIAGNOSIS — F17210 Nicotine dependence, cigarettes, uncomplicated: Secondary | ICD-10-CM | POA: Diagnosis not present

## 2023-05-27 DIAGNOSIS — K219 Gastro-esophageal reflux disease without esophagitis: Secondary | ICD-10-CM | POA: Diagnosis not present

## 2023-05-28 DIAGNOSIS — Z79899 Other long term (current) drug therapy: Secondary | ICD-10-CM | POA: Diagnosis not present

## 2023-05-28 DIAGNOSIS — J441 Chronic obstructive pulmonary disease with (acute) exacerbation: Secondary | ICD-10-CM | POA: Diagnosis not present

## 2023-05-28 DIAGNOSIS — E119 Type 2 diabetes mellitus without complications: Secondary | ICD-10-CM | POA: Diagnosis not present

## 2023-05-28 DIAGNOSIS — I10 Essential (primary) hypertension: Secondary | ICD-10-CM | POA: Diagnosis not present

## 2023-05-28 DIAGNOSIS — E860 Dehydration: Secondary | ICD-10-CM | POA: Diagnosis not present

## 2023-06-10 ENCOUNTER — Telehealth: Payer: Self-pay | Admitting: *Deleted

## 2023-06-10 NOTE — Progress Notes (Signed)
 Complex Care Management Care Guide Note  06/10/2023 Name: Katherine Romero MRN: [id number] DOB: [birth year]  Katherine Romero is a 73 y.o. year old female who is a primary care patient of Kirstie Peri, MD and is actively engaged with the care management team. I reached out to [name] by phone today to assist with re-scheduling  with the RN Case Manager.  Follow up plan: Unsuccessful telephone outreach attempt made. A HIPAA compliant phone message was left for the patient providing contact information and requesting a return call.  Gwenevere Ghazi  [facility]  [facility], [facility] Guide  Direct Dial: [phone]  Fax [phone]

## 2023-06-15 ENCOUNTER — Encounter: Payer: Self-pay | Admitting: *Deleted

## 2023-06-15 ENCOUNTER — Ambulatory Visit: Payer: Self-pay | Admitting: *Deleted

## 2023-06-15 DIAGNOSIS — J449 Chronic obstructive pulmonary disease, unspecified: Secondary | ICD-10-CM | POA: Diagnosis not present

## 2023-06-15 DIAGNOSIS — I509 Heart failure, unspecified: Secondary | ICD-10-CM | POA: Diagnosis not present

## 2023-06-15 NOTE — Patient Outreach (Signed)
 Care Coordination   Follow Up Visit Note   06/15/2023 Name: Katherine Romero MRN: [id number] DOB: 09-09-1950  Katherine Romero is a 73 y.o. year old female who sees Kirstie Peri, MD for primary care. I spoke with  [name] by phone today.  What matters to the patients health and wellness today?  Prevent COPD exacerbation and complete application for Medicare Extra Help.    Goals Addressed             This Visit's Progress    Care Management Needs   Not on track    Care Management Goals: Patient/son will complete application for Medicare Extra Help for Prescription Drugs online Son has received email with link to applications and instructions. He has not completed it yet.  Patient/son will reach out to RN Care manager at [phone] with any questions about the application or other needs     Manage COPD   On track    Care Coordination Goals: Patient will keep all medical appointments Patient will call provider with any new or worsening symptoms Patient will track and manage COPD triggers Patient will state understanding of proper use of medications used for management of COPD including inhalers Patient will use medications as directed Patient will engage in light exercise as tolerated 3-5 days a week to aid in the the management of COPD Provided will use infection prevention strategies to reduce risk of respiratory infection Patient will use incentive spirometer 10-15 times several times a day Patient will state understanding of the importance of adequate rest and management of fatigue with COPD Patient will continue to use O2 at 3 L per minute (or as directed by provider) via nasal canula per floor concentrator  Patient will call RN Care Manager [phone] with any care coordination or resource needs         SDOH assessments and interventions completed:  Yes  SDOH Interventions Today    Flowsheet Row Most Recent Value  SDOH Interventions   Food Insecurity  Interventions Intervention Not Indicated  Transportation Interventions Patient Resources (Friends/Family)  Financial Strain Interventions Other (Comment)  [difficulty paying for namebrand prescription medications. Working with Medical illustrator to fill out application for [employer] Extra Help.]        Care Coordination Interventions:  Yes, provided  Interventions Today    Flowsheet Row Most Recent Value  Chronic Disease   Chronic disease during today's visit Diabetes, Hypertension (HTN), Chronic Obstructive Pulmonary Disease (COPD), Other  [recent hospitalization for COPD exacerbation]  General Interventions   General Interventions Discussed/Reviewed General Interventions Discussed, General Interventions Reviewed, Doctor Visits, Durable Medical Equipment (DME)  Doctor Visits Discussed/Reviewed Doctor Visits Discussed, Doctor Visits Reviewed, PCP, Specialist  [facility] hospital follow-up with PCP after discharge from [facility]]  Durable Medical Equipment (DME) Glucomoter, BP Cuff, Oxygen, Other  [3 L of oxygen, incentive spirometer]  PCP/Specialist Visits Compliance with follow-up visit  Applications Other  [Medicare Extra Help for Prescription Drugs]  Exercise Interventions   Exercise Discussed/Reviewed Exercise Discussed, Exercise Reviewed, Physical Activity  Physical Activity Discussed/Reviewed Physical Activity Discussed, Physical Activity Reviewed  [encouraged light exercise 3 to 5 days per week]  Education Interventions   Education Provided Provided Education, Provided Printed Education  [printed information on Medicare Extra Help and how to apply and mailed again. Email was sent during last phone call.]  Provided Verbal Education On Blood Sugar Monitoring, Nutrition, Applications, Exercise, Medication, When to see the doctor, Other  [use incentive spirometer 10-15 times several times  a day]  Applications Other  [Medicare Extra Help for Prescription Drugs]  Nutrition Interventions    Nutrition Discussed/Reviewed Nutrition Discussed, Nutrition Reviewed, Fluid intake  [increase water intake to stay hydrated]  Pharmacy Interventions   Pharmacy Dicussed/Reviewed Pharmacy Topics Discussed, Pharmacy Topics Reviewed, Medications and their functions, Affording Medications  [complete appliacation for Medicare Extra Help for Prescription Drugs either online or by one of the other options. Has not missed taking any medication.]  Safety Interventions   Safety Discussed/Reviewed Safety Discussed, Safety Reviewed, Fall Risk, Home Safety  Home Safety Assistive Devices       Follow up plan: Follow up call scheduled for 06/29/23    Encounter Outcome:  Patient Visit Completed   Demetrios Loll, RN, BSN [place]  [facility], [facility] Health RN Care Manager Direct Dial: [phone]

## 2023-06-18 NOTE — Progress Notes (Signed)
 Complex Care Management Care Guide Note  06/18/2023 Name: Katherine Romero MRN: [id number] DOB: 1950/05/04  [name] is a 73 y.o. year old female who is a primary care patient of Kirstie Peri, MD and is actively engaged with the care management team. I reached out to [name] by phone today to assist with re-scheduling  with the RN Case Manager.  Follow up plan: Telephone appointment with complex care management team member scheduled for:  3/17 completed follow up 3/31 Gwenevere Ghazi  [facility]  [employer], [facility] Guide  Direct Dial: [phone]  Fax [phone]

## 2023-06-29 ENCOUNTER — Ambulatory Visit: Payer: Self-pay | Admitting: *Deleted

## 2023-06-29 ENCOUNTER — Encounter: Payer: Self-pay | Admitting: *Deleted

## 2023-06-30 NOTE — Patient Outreach (Signed)
 Care Coordination   Follow Up Visit Note   06/29/2023 Name: Katherine Romero MRN: [id number] DOB: Oct 20, 1950  Katherine Romero is a 73 y.o. year old female who sees Katherine Peri, MD for primary care. I spoke with  Katherine Romero by phone today.  What matters to the patients health and wellness today?  Patient did not endorse any specific questions or concerns.    Goals Addressed             This Visit's Progress    Care Management Needs   Not on track    Care Management Goals: Patient/son will complete application for Medicare Extra Help for Prescription Drugs online Son has received email with link to applications and instructions. He has not completed it yet.  Patient/son will reach out to RN Care manager at [phone] with any questions about the application or other needs     Manage COPD   On track    Care Coordination Goals: Patient will keep all medical appointments Katherine Romero 07/14/23. Consider pulmonary referral.  Patient will call provider with any new or worsening symptoms Patient will track and manage COPD triggers Patient will engage in light exercise as tolerated 3-5 days a week to aid in the the management of COPD Provided will use infection prevention strategies to reduce risk of respiratory infection Patient will use incentive spirometer 10-15 times several times a day Patient will continue to use O2 at 4 L per minute (or as directed by provider) via nasal canula per floor concentrator  Patient will discuss O2 flow rate with Katherine Romero at visit Patient will call RN Care Manager [phone] with any care coordination or resource needs         SDOH assessments and interventions completed:  Yes  SDOH Interventions Today    Flowsheet Row Most Recent Value  SDOH Interventions   Financial Strain Interventions Other (Comment)  [difficulty paying for namebrand prescription medications. Provided resourcews and infromation on how to fill out application for Medicare  Extra Help.]        Care Coordination Interventions:  Yes, provided  Interventions Today    Flowsheet Row Most Recent Value  Chronic Disease   Chronic disease during today's visit Chronic Obstructive Pulmonary Disease (COPD), Hypertension (HTN), Diabetes  [Recent hospitalization for COPD exacerbation]  General Interventions   General Interventions Discussed/Reviewed General Interventions Discussed, General Interventions Reviewed, Durable Medical Equipment (DME), Doctor Visits, Communication with  Doctor Visits Discussed/Reviewed Doctor Visits Discussed, Doctor Visits Reviewed, PCP  Durable Medical Equipment (DME) Oxygen, Glucomoter, Other, BP Cuff  [nebulizer, incentive spirometer. Has increased O2 to 4 L/min]  PCP/Specialist Visits Compliance with follow-up visit  [PCP visit with Katherine Romero scehduled for 07/14/23]  Communication with PCP/Specialists  [Secure fax regarding increase in O2 usage from 3 L/min to 4 L/min and question if a pulmonary referral would be appropriate.]  Applications Other  [Has been provided information on Medicare Extra Help for Prescription Drugs and the avenues by which they can apply. Has not applied as of today.]  Exercise Interventions   Exercise Discussed/Reviewed Physical Activity  Physical Activity Discussed/Reviewed Physical Activity Discussed, Physical Activity Reviewed  [limited by SOB due to COPD. Encouraged to increase as tolerated with a goal of light exercise 3 to 5 times per week]  Education Interventions   Education Provided Provided Education  Provided Verbal Education On Medication, When to see the doctor, Other, Exercise, Applications  [continue to incentive spirometer. Complete application for Medicare Extra Help. Reach  out to provider with any new or worsening symptoms. Consider pulmonary referral for COPD management.]  Applications Other  [Has been provided information on Medicare Extra Help for Prescription Drugs and the avenues by which they can  apply. Has not applied as of today.]  Nutrition Interventions   Nutrition Discussed/Reviewed Nutrition Discussed, Nutrition Reviewed, Carbohydrate meal planning, Adding fruits and vegetables, Fluid intake, Decreasing sugar intake  [eat 3 meals per day]  Pharmacy Interventions   Pharmacy Dicussed/Reviewed Pharmacy Topics Discussed, Pharmacy Topics Reviewed, Medications and their functions, Affording Medications  [complete appliacation for Medicare Extra Help for Prescription Drugs either online or by one of the other options. Has not missed taking any medication.]  Safety Interventions   Safety Discussed/Reviewed Safety Discussed, Safety Reviewed, Fall Risk, Home Safety  Home Safety Assistive Devices       Follow up plan: Follow up call scheduled for 07/13/23    Encounter Outcome:  Patient Visit Completed   Demetrios Loll, RN, BSN [place]  [facility] Health RN Care Manager Direct Dial: [phone]  Fax: [phone]

## 2023-07-13 ENCOUNTER — Encounter: Admitting: *Deleted

## 2023-07-13 ENCOUNTER — Telehealth: Payer: Self-pay | Admitting: *Deleted

## 2023-07-13 NOTE — Progress Notes (Signed)
 Complex Care Management Care Guide Note  07/13/2023 Name: Katherine Romero MRN: [id number] DOB: [birth year]  Katherine Romero is a 73 y.o. year old female who is a primary care patient of Theoplis Fix, MD and is actively engaged with the care management team. I reached out to Georga Killings by phone today to assist with re-scheduling  with the RN Case Manager.  Follow up plan: Unsuccessful telephone outreach attempt made. A HIPAA compliant phone message was left for the patient providing contact information and requesting a return call.  Barnie Bora  [facility]  Value-Based Care Institute, [facility] Guide  Direct Dial: [phone]  Fax [phone]

## 2023-07-16 DIAGNOSIS — J449 Chronic obstructive pulmonary disease, unspecified: Secondary | ICD-10-CM | POA: Diagnosis not present

## 2023-07-16 DIAGNOSIS — I509 Heart failure, unspecified: Secondary | ICD-10-CM | POA: Diagnosis not present

## 2023-07-20 DIAGNOSIS — J449 Chronic obstructive pulmonary disease, unspecified: Secondary | ICD-10-CM | POA: Diagnosis not present

## 2023-07-20 NOTE — Progress Notes (Signed)
 Complex Care Management Care Guide Note  07/20/2023 Name: [name] MRN: [id number] DOB: [birth year]  Katherine Romero is a 73 y.o. year old female who is a primary care patient of Theoplis Fix, MD and is actively engaged with the care management team. I reached out to Georga Killings by phone today to assist with re-scheduling  with the RN Case Manager.  Follow up plan: Unsuccessful telephone outreach attempt made. A HIPAA compliant phone message was left for the patient providing contact information and requesting a return call.  Barnie Bora  [facility]  Value-Based Care Institute, [facility] Guide  Direct Dial: [phone]  Fax [phone]

## 2023-07-21 ENCOUNTER — Encounter (INDEPENDENT_AMBULATORY_CARE_PROVIDER_SITE_OTHER): Payer: Medicare HMO | Admitting: Ophthalmology

## 2023-07-21 ENCOUNTER — Encounter (INDEPENDENT_AMBULATORY_CARE_PROVIDER_SITE_OTHER): Payer: Self-pay

## 2023-07-21 DIAGNOSIS — H35033 Hypertensive retinopathy, bilateral: Secondary | ICD-10-CM

## 2023-07-21 DIAGNOSIS — E119 Type 2 diabetes mellitus without complications: Secondary | ICD-10-CM

## 2023-07-21 DIAGNOSIS — H353134 Nonexudative age-related macular degeneration, bilateral, advanced atrophic with subfoveal involvement: Secondary | ICD-10-CM

## 2023-07-21 DIAGNOSIS — Z961 Presence of intraocular lens: Secondary | ICD-10-CM

## 2023-07-21 DIAGNOSIS — I10 Essential (primary) hypertension: Secondary | ICD-10-CM

## 2023-07-21 NOTE — Progress Notes (Signed)
 Complex Care Management Care Guide Note  07/21/2023 Name: Katherine Romero MRN: [id number] DOB: [birth year]  Katherine Romero is a 73 y.o. year old female who is a primary care patient of Theoplis Fix, MD and is actively engaged with the care management team. I reached out to Georga Killings by phone today to assist with re-scheduling  with the RN Case Manager.  Follow up plan: Telephone appointment with complex care management team member scheduled for:  4/23  Barnie Bora  [facility] Health  Value-Based Care Institute, [facility] Guide  Direct Dial: [phone]  Fax [phone]

## 2023-07-22 ENCOUNTER — Telehealth: Payer: Self-pay | Admitting: *Deleted

## 2023-07-22 ENCOUNTER — Encounter: Payer: Self-pay | Admitting: *Deleted

## 2023-07-29 ENCOUNTER — Other Ambulatory Visit: Payer: Self-pay | Admitting: *Deleted

## 2023-08-15 DIAGNOSIS — I509 Heart failure, unspecified: Secondary | ICD-10-CM | POA: Diagnosis not present

## 2023-08-15 DIAGNOSIS — J449 Chronic obstructive pulmonary disease, unspecified: Secondary | ICD-10-CM | POA: Diagnosis not present

## 2023-08-18 DIAGNOSIS — J449 Chronic obstructive pulmonary disease, unspecified: Secondary | ICD-10-CM | POA: Diagnosis not present

## 2023-08-28 ENCOUNTER — Other Ambulatory Visit: Payer: Self-pay | Admitting: *Deleted

## 2023-08-28 ENCOUNTER — Encounter: Payer: Self-pay | Admitting: *Deleted

## 2023-08-28 ENCOUNTER — Other Ambulatory Visit: Payer: Self-pay

## 2023-08-28 DIAGNOSIS — Z748 Other problems related to care provider dependency: Secondary | ICD-10-CM

## 2023-08-28 DIAGNOSIS — J449 Chronic obstructive pulmonary disease, unspecified: Secondary | ICD-10-CM

## 2023-08-28 NOTE — Patient Outreach (Unsigned)
 Complex Care Management   Visit Note  08/28/2023  Name:  Katherine Romero MRN: [id number] DOB: May 27, 1950  Situation: Referral received for Complex Care Management related to COPD and SDOH Barriers:  Transportation Level of Care Needs. I obtained verbal consent from Patient.  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   Essential (primary) hypertension 11/04/2018   Generalized anxiety disorder 09/25/2015   GERD (gastroesophageal reflux disease)    Heme positive stool 09/14/2015   History of COVID-19 08/25/2020   Hypercholesteremia    Hyperlipidemia    Hypokalemia 08/26/2020   Iron deficiency anemia 08/10/2015   Major depressive disorder 10/14/2017   Nonrheumatic aortic valve stenosis 04/28/2019   Pneumonia due to COVID-19 virus 08/25/2020   PTSD (post-traumatic stress disorder)    Shortness of breath 11/04/2018   Tobacco use 07/19/2015   Type II diabetes mellitus     Assessment: Patient Reported Symptoms:  Cognitive Cognitive Status: Alert and oriented to person, place, and time, Normal speech and language skills Cognitive/Intellectual Conditions Management [RPT]: None reported or documented in medical history or problem list   Health Maintenance Behaviors: Annual physical exam, Sleep adequate, Immunizations Healing Pattern: Unsure Health Facilitated by: Rest  Neurological Neurological Review of Symptoms: No symptoms reported    HEENT HEENT Symptoms Reported: No symptoms reported HEENT Conditions: Vision problem(s) Vision Problems: blindness/vision loss (macular  degeneration) HEENT Comment: Managed by Dr Karyl Paget. Per patient, improvement is not to be expected. Needs to reschedule visit she missed in April due to lack of transportation. Referral to Care Management BSW for transportation assistance. Vision problem(s)  Cardiovascular Cardiovascular Symptoms Reported: No symptoms reported Does patient have uncontrolled Hypertension?: No Cardiovascular Conditions: Heart failure, Valvular disease, Hypertension Cardiovascular Management Strategies: Medication therapy, Routine screening Cardiovascular Self-Management Outcome: 3 (uncertain) Cardiovascular Comment: Conditions are stable and medications are managed by PCP. No cardiologist at this time.  Respiratory Respiratory Symptoms Reported: Dry cough, Shortness of breath Respiratory Conditions: COPD Respiratory Self-Management Outcome: 3 (uncertain) Respiratory Comment: reviewed proper use of albuterol  nebulizer and albuterol  inhaler as well as Breztri for maintenance. Uses O2 at 4L per min via [place] but needs a face mask due to mouth breathing. RN to [employer] and request mask. Will collaborate with PCP office if order is needed. Managed by PCP.  Endocrine Patient reports the following symptoms related to hypoglycemia or hyperglycemia : No symptoms reported Is patient diabetic?: Yes Is patient checking blood sugars at home?: Yes Endocrine Conditions: Diabetes Endocrine Management Strategies: Medication therapy, Routine screening, Diet modification Endocrine Self-Management Outcome: 3 (uncertain)  Gastrointestinal Gastrointestinal Symptoms Reported: No symptoms reported Gastrointestinal Conditions: Reflux/heartburn Gastrointestinal Management Strategies: Medication therapy Gastrointestinal Self-Management Outcome: 4 (good)    Genitourinary Genitourinary Symptoms Reported: No symptoms reported    Integumentary Integumentary Symptoms Reported: No symptoms reported    Musculoskeletal Musculoskelatal  Symptoms Reviewed: Difficulty walking, Weakness, Unsteady gait Musculoskeletal Conditions: Osteoarthritis, Osteoporosis, Unsteady gait, Mobility limited Musculoskeletal Management Strategies: Medical device, Medication therapy, Routine screening Musculoskeletal Self-Management Outcome: 3 (uncertain) Falls in the past year?: No Number of falls in past year: 1 or less Was there an  injury with Fall?: No Fall Risk Category Calculator: 0 Patient Fall Risk Level: Low Fall Risk Patient at Risk for Falls Due to: Impaired balance/gait, Impaired mobility, Medication side effect Fall risk Follow up: Falls evaluation completed, Falls prevention discussed  Psychosocial Psychosocial Symptoms Reported: Anxiety - if selected complete GAD, Depression - if selected complete PHQ 2-9 Behavioral Health Conditions: Depression, Anxiety, Post traumatic stress Behavioral Management Strategies: Medication therapy Behavioral Health Self-Management Outcome: 4 (good) Behavioral Health Comment: Medication managed by PCP          08/28/2023    4:36 PM  Depression screen PHQ 2/9  Decreased Interest 0  Down, Depressed, Hopeless 1  PHQ - 2 Score 1    There were no vitals filed for this visit.  Medications Reviewed Today     Reviewed by Ethelene Herald, RN (Registered Nurse) on 08/28/23 at 1559  Med List Status: <None>   Medication Order Taking? Sig Documenting Provider Last Dose Status Informant  albuterol  (PROVENTIL ) (2.5 MG/3ML) 0.083% nebulizer solution [id number] Yes INHALE ONE VIAL IN NEBULIZER EVERY 6 HOURS AS NEEDED FOR [facility] OF BREATH [provider] Taking Active Self           Med Note Princella Brooklyn, DEBBIE G   Thu Jul 19, 2015 11:25 AM) Received from: External Pharmacy  Albuterol  Sulfate (PROAIR  RESPICLICK) 108 (90 Base) MCG/ACT AEPB [id number] Yes Inhale 2 puffs into the lungs every 6 (six) hours as needed (for wheezing/shortness of breath).  [provider] Taking Active Self            Med Note Kerman Peck May 21, 2017  3:00 PM)    ALPRAZolam (XANAX) 0.5 MG tablet [id number] Yes Take 0.5 mg by mouth at bedtime as needed for anxiety. [provider] Taking Active   amLODipine  (NORVASC ) 10 MG tablet [id number] Yes Take 1 tablet by mouth daily. [provider] Taking Active   ARIPiprazole  (ABILIFY ) 15 MG tablet [id number]  Take 1 tablet (15 mg total) by mouth daily. Todd Fossa, MD  Expired 09/29/22 2359   aspirin  81 MG EC tablet [id number] Yes Take by mouth. [provider] Taking Active   Aspirin -Salicylamide-Caffeine (BC HEADACHE POWDER PO) [id number] Yes Take 1 Package by mouth every 4 (four) hours as needed (PAIN). [provider] Taking Active Self  atorvastatin  (LIPITOR) 40 MG tablet [id number] Yes Take 40 mg by mouth daily. [provider] Taking Active Self  BREZTRI AEROSPHERE 160-9-4.8 MCG/ACT AERO inhaler [id number] Yes Inhale 2 puffs into the lungs in the morning and at bedtime. [provider] Taking Active   budesonide -formoterol (SYMBICORT) 160-4.5 MCG/ACT inhaler [id number] No Inhale 2 puffs into the lungs 2 (two) times daily.  Patient not taking: Reported on 08/28/2023   [provider] Not Taking Active            Med Note Ethelene Herald   Fri Aug 28, 2023  3:59 PM) Changed to [facility] PLUS (ENSURE PLUS) Biagio Bucy [id number] Yes Take 237 mLs by mouth 2 (two) times daily between meals. [provider] Taking Active Self  FEROSUL 325 (65 Fe) MG tablet [id number] Yes Take 325 mg by mouth daily. [provider] Taking Active   furosemide (LASIX) 20 MG tablet [id number] Yes Take 20 mg by mouth 2 (two) times daily. [provider] Taking Active   gabapentin (NEURONTIN) 100 MG capsule [id number] Yes Take 100 mg by mouth 3 (three) times daily. [provider] Taking Active  gemfibrozil (LOPID) 600 MG tablet [id number] Yes Take 600 mg by mouth 2 (two) times  daily. [provider] Taking Active   hydrochlorothiazide (HYDRODIURIL) 25 MG tablet [id number] Yes Take 1 tablet by mouth daily. [provider] Taking Active   JARDIANCE 10 MG TABS tablet [id number] Yes Take 25 mg by mouth daily. [provider] Taking Active   metFORMIN (GLUCOPHAGE) 500 MG tablet [id number] Yes Take 1 tablet by mouth daily. [provider] Taking Active   metoprolol  (LOPRESSOR ) 100 MG tablet [id number] Yes Take 100 mg by mouth 2 (two) times daily. [provider] Taking Active Self           Med Note Sulema Endo, JENNIFER N   Thu May 21, 2017  3:03 PM) HAS NOT HAD IN OVER 1 WEEK.  WAITING ON REFILL FROM DR OFFICE   metoprolol  succinate (TOPROL -XL) 25 MG 24 hr tablet [id number] Yes Take 25 mg by mouth 2 (two) times daily. [provider] Taking Active   mirtazapine  (REMERON ) 7.5 MG tablet [id number]  Take 1 tablet (7.5 mg total) by mouth at bedtime. Todd Fossa, MD  Expired 09/29/22 2359   montelukast (SINGULAIR) 10 MG tablet [id number] Yes Take 10 mg by mouth at bedtime. [provider] Taking Active   pantoprazole  (PROTONIX ) 40 MG tablet [id number] Yes Take 40 mg by mouth daily. [provider] Taking Active   sertraline  (ZOLOFT ) 100 MG tablet [id number]  Take 1.5 tablets (150 mg total) by mouth daily. Eappen, Saramma, MD  Expired 07/22/23 2359   sertraline  (ZOLOFT ) 50 MG tablet [id number] Yes Take 50 mg by mouth daily. [provider] Taking Active   traZODone  (DESYREL ) 50 MG tablet [id number]  Take 0.5-1 tablets (25-50 mg total) by mouth at bedtime as needed for sleep. Todd Fossa, MD  Expired 02/24/22 2359             Recommendation:   Referral to: Care Management BSW Consider LCSW for counseling services  Follow Up Plan:   Telephone follow up appointment date/time:  09/22/23 at 3:30  Michele Ahle, RN, BSN [place]  [facility] Health RN Care Manager Direct Dial: [phone]  Fax:  [phone]

## 2023-09-01 ENCOUNTER — Other Ambulatory Visit: Payer: Self-pay

## 2023-09-01 NOTE — Patient Instructions (Signed)
 Visit Information  Thank you for taking time to visit with me today. Please don't hesitate to contact me if I can be of assistance to you before our next scheduled appointment.  Our next appointment is by telephone on 09/15/2023 at 1pm Please call the care guide team at [phone] if you need to cancel or reschedule your appointment.    Please call the Suicide and Crisis Lifeline: 988 call the [place]  National Suicide Prevention Lifeline: (804)887-4054 or TTY: 951-423-2209 TTY 916-670-3486) to talk to a trained counselor call 1-800-273-TALK (toll free, 24 hour hotline) call the [facility]: [phone] call 911 if you are experiencing a Mental Health or Behavioral Health Crisis or need someone to talk to.  Patient verbalizes understanding of instructions and care plan provided today and agrees to view in MyChart. Active MyChart status and patient understanding of how to access instructions and care plan via MyChart confirmed with patient.     Haven Lion, BSW [place]  Value Based Care Institute Social Worker, [employer] Health [phone]

## 2023-09-01 NOTE — Patient Outreach (Signed)
 Complex Care Management   Visit Note  09/01/2023  Name:  Katherine Romero MRN: [id number] DOB: 1951/03/19  Situation: Referral received for Complex Care Management related to SDOH Barriers:  Transportation I obtained verbal consent from Patient.  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   Essential (primary) hypertension 11/04/2018   Generalized anxiety disorder 09/25/2015   GERD (gastroesophageal reflux disease)    Heme positive stool 09/14/2015   History of COVID-19 08/25/2020   Hypercholesteremia    Hyperlipidemia    Hypokalemia 08/26/2020   Iron deficiency anemia 08/10/2015   Major depressive disorder 10/14/2017   Nonrheumatic aortic valve stenosis 04/28/2019   Pneumonia due to COVID-19 virus 08/25/2020   PTSD (post-traumatic stress disorder)    Shortness of breath 11/04/2018   Tobacco use 07/19/2015   Type II diabetes mellitus     Assessment: BSW outreached patient to assist with SDOH barriers. BSW identified challenges with transportation. The patient was sent resources and a follow up call was scheduled for 09/15/2023  SDOH Interventions Today    Flowsheet Row Most Recent Value  SDOH Interventions   Transportation Interventions Community Resources Provided        Recommendation:   No recommendations at this time.  Follow Up Plan:   Telephone follow-up on 09/15/2023 at 1pm  Haven Lion, BSW [place]  [facility] Social Worker, [facility] [phone]

## 2023-09-15 ENCOUNTER — Other Ambulatory Visit: Payer: Self-pay

## 2023-09-15 NOTE — Patient Instructions (Signed)
 Visit Information  Thank you for taking time to visit with me today. Please don't hesitate to contact me if I can be of assistance to you before our next scheduled appointment.  Your next care management appointment is by telephone on 10/20/2023 at 1pm  Telephone follow-up on 10/20/2023 at 1pm  Please call the care guide team at [phone] if you need to cancel, schedule, or reschedule an appointment.   Please call the Suicide and Crisis Lifeline: 988 call the [place]  National Suicide Prevention Lifeline: (626)244-1358 or TTY: (332) 512-8975 TTY (313) 625-3386) to talk to a trained counselor call 1-800-273-TALK (toll free, 24 hour hotline) call 911 if you are experiencing a Mental Health or Behavioral Health Crisis or need someone to talk to.  Haven Lion, BSW [place]  [facility] Social Worker, [facility] [phone]

## 2023-09-15 NOTE — Patient Outreach (Signed)
 Complex Care Management   Visit Note  09/15/2023  Name:  Katherine Romero MRN: [id number] DOB: Aug 21, 1950  Situation: Referral received for Complex Care Management related to SDOH Barriers:  Transportation I obtained verbal consent from Patient.  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   Essential (primary) hypertension 11/04/2018   Generalized anxiety disorder 09/25/2015   GERD (gastroesophageal reflux disease)    Heme positive stool 09/14/2015   History of COVID-19 08/25/2020   Hypercholesteremia    Hyperlipidemia    Hypokalemia 08/26/2020   Iron deficiency anemia 08/10/2015   Major depressive disorder 10/14/2017   Nonrheumatic aortic valve stenosis 04/28/2019   Pneumonia due to COVID-19 virus 08/25/2020   PTSD (post-traumatic stress disorder)    Shortness of breath 11/04/2018   Tobacco use 07/19/2015   Type II diabetes mellitus     Assessment: BSW outreached patient to confirm if they had received resources that had been sent. Patient states that they had not received the resources. BSW will resend resources to patient and will follow up.    Recommendation:   No recommendation at this time.  Follow Up Plan:   Telephone follow-up 10/20/2023 at 1pm  Haven Lion, BSW [place]  [facility] Social Worker, [facility] [phone]

## 2023-09-22 ENCOUNTER — Telehealth: Payer: Self-pay | Admitting: *Deleted

## 2023-09-22 ENCOUNTER — Encounter: Payer: Self-pay | Admitting: *Deleted

## 2023-10-20 ENCOUNTER — Other Ambulatory Visit: Payer: Self-pay

## 2023-12-03 ENCOUNTER — Other Ambulatory Visit: Payer: Self-pay

## 2023-12-03 DIAGNOSIS — J441 Chronic obstructive pulmonary disease with (acute) exacerbation: Secondary | ICD-10-CM

## 2023-12-03 NOTE — Patient Outreach (Signed)
 Complex Care Management   Visit Note  12/03/2023  Name:  Katherine Romero MRN: [id number] DOB: [birth year]  Situation: Referral received for Complex Care Management related to Follow up  I obtained verbal consent from Patient.  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 Tamea, [facility]   Arthritis    Atypical chest pain 09/25/2015   Chronic diastolic (congestive) heart failure 08/29/2020   COPD (chronic obstructive pulmonary disease) 09/25/2015   Elliptocytosis 07/19/2015   Essential (primary) hypertension 11/04/2018   Generalized anxiety disorder 09/25/2015   GERD (gastroesophageal reflux disease)    Heme positive stool 09/14/2015   History of COVID-19 08/25/2020   Hypercholesteremia    Hyperlipidemia    Hypokalemia 08/26/2020   Iron deficiency anemia 08/10/2015   Major depressive disorder 10/14/2017   Nonrheumatic aortic valve stenosis 04/28/2019   Pneumonia due to COVID-19 virus 08/25/2020   PTSD (post-traumatic stress disorder)    Shortness of breath 11/04/2018   Tobacco use 07/19/2015   Type II diabetes mellitus     Assessment: BSW reached out to patient today to follow up and confirm whether she had received the resources that were previously sent for the second time. Patient confirmed that she did receive the resources; however, her family member did not read the entire letter, and she was unaware of some of the resources listed toward the end. Patient requested that BSW resend the letter so she could review the information in full. During the call, patient shared that her husband is currently on hospice care, and they are receiving assistance from a home aide at this time. Therefore, additional home aide support is not needed at the moment.Patient also stated that she has not spoken  with her RN case manager and would like to be connected with them.BSW will resend the letter with the resources once again and schedule patient with her RN case Production designer, theatre/television/film. After completing these task, BSW will close out the case.   Recommendation:   Referral to: RNCM  Follow Up Plan:   Patient has met all care management goals. Care Management case will be closed. Patient has been provided contact information should new needs arise.   Orlean Fey, BSW [place]  [facility] Social Worker, [facility] [phone]

## 2023-12-03 NOTE — Patient Instructions (Signed)
   Visit Information  Thank you for taking time to visit with me today. Please don't hesitate to contact me if I can be of assistance to you before our next scheduled appointment.  Your next care management appointment is no further scheduled appointments.    Patient has met all care management goals. Care Management case will be closed. Patient has been provided contact information should new needs arise.   Please call the care guide team at [phone] if you need to cancel, schedule, or reschedule an appointment.   Please call the Suicide and Crisis Lifeline: 988 call the [place]  National Suicide Prevention Lifeline: (860)582-1212 or TTY: (563)477-4864 TTY 864 223 0578) to talk to a trained counselor call 1-800-273-TALK (toll free, 24 hour hotline) call the [facility]: [phone] call 911 if you are experiencing a Mental Health or Behavioral Health Crisis or need someone to talk to.  Haven Lion, BSW [place]  [facility] Social Worker, [facility] [phone]

## 2023-12-06 DIAGNOSIS — J449 Chronic obstructive pulmonary disease, unspecified: Secondary | ICD-10-CM | POA: Diagnosis not present

## 2023-12-06 DIAGNOSIS — I509 Heart failure, unspecified: Secondary | ICD-10-CM | POA: Diagnosis not present

## 2023-12-11 ENCOUNTER — Telehealth: Payer: Self-pay

## 2023-12-11 NOTE — Patient Instructions (Signed)
 Katherine Romero - I am sorry I was unable to reach you today for our scheduled appointment. I work with Maree Isles, MD and am calling to support your healthcare needs. Please contact me at [phone] at your earliest convenience. I look forward to speaking with you soon.   Thank you,  Hendricks Her RN, BSN  [place] [facility] V[facility]-Population Health RN Case Manager   Direct [phone]

## 2023-12-17 ENCOUNTER — Other Ambulatory Visit: Payer: Self-pay

## 2023-12-17 NOTE — Patient Instructions (Signed)
 Visit Information  Thank you for taking time to visit with me today. Please don't hesitate to contact me if I can be of assistance to you before our next scheduled appointment.  Our next appointment is by telephone on 12-30-2023 at 9:45 AM  Please call the care guide team at [phone] if you need to cancel or reschedule your appointment.   Following is a copy of your care plan:   Goals Addressed             This Visit's Progress    VBCI RN Care Plan       Problems:  Chronic Disease Management support and education needs related to COPD  Goal: Over the next 90 days the Patient will attend all scheduled medical appointments: with providers as evidenced by appointment encounters in EMR         collaborate with the care management team towards completion of advanced directives  as evidenced by having advanced directives scanned into EMR    demonstrate ongoing self health care management ability of COPD as evidenced by  taking medications and inhalers as prescribed and preventing infections   not experience hospital admission as evidenced by review of electronic medical record. Hospital Admissions in last 6 months = 0 take all medications exactly as prescribed and will call provider for medication related questions as evidenced by communication with provider for questions or concerns about medications      Interventions:   COPD Interventions: Advised patient to engage in light exercise as tolerated 3-5 days a week to aid in the the management of COPD Advised patient to track and manage COPD triggers Advised patient to self assesses COPD action plan zone and make appointment with provider if in the yellow zone for 48 hours without improvement Assessed social determinant of health barriers Discussed the importance of adequate rest and management of fatigue with COPD Provided education about and advised patient to utilize infection prevention strategies to reduce risk of respiratory  infection Provided instruction about proper use of medications used for management of COPD including inhalers Provided patient with basic written and verbal COPD education on self care/management/and exacerbation prevention Screening for signs and symptoms of depression related to chronic disease state  Use of home oxygen   Patient Self-Care Activities:  Attend all scheduled provider appointments Call pharmacy for medication refills 3-7 days in advance of running out of medications Call provider office for new concerns or questions  Notify RN Care Manager of TOC call rescheduling needs Take medications as prescribed   identify and avoid work-related triggers identify and remove indoor air pollutants limit outdoor activity during cold weather do breathing exercises every day develop a rescue plan eliminate symptom triggers at home keep follow-up appointments:   get at least 7 to 8 hours of sleep at night use devices that will help like a cane, sock-puller or reacher practice relaxation or meditation daily do breathing exercises at least 2 times each day do exercises in a comfortable position that makes breathing as easy as possible  Plan:  Telephone follow up appointment with care management team member scheduled for:  10-04/2023 at 9:45 AM           VBCI RN Care Plan       Problems:  Chronic Disease Management support and education needs related to DMII  Goal: Over the next 90 days the Patient will attend all scheduled medical appointments: with providers as evidenced by appointment encounter in EMR  collaborate with the care management team towards completion of advanced directives   as evidenced by advanced directives being scanned into EMR   continue to work with Medical illustrator and/or Social Worker to address care management and care coordination needs related to DMII as evidenced by adherence to care management team scheduled appointments     not experience hospital  admission as evidenced by review of electronic medical record. Hospital Admissions in last 6 months = 0 take all medications exactly as prescribed and will call provider for medication related questions as evidenced by communication with provider for concerns or questions about medications     verbalize basic understanding of DMII disease process and self health management plan as evidenced by explaining in own words the importance of daily foot checks, monitoring daily blood sugar and vitals and what foods to avoid  Patient will begin to take daily FSBS readings as evidenced by a FSBS log with daily readings   Interventions:   Diabetes Interventions: Assessed patient's understanding of A1c goal: <6.5% Provided education to patient about basic DM disease process Reviewed medications with patient and discussed importance of medication adherence Counseled on importance of regular laboratory monitoring as prescribed Discussed plans with patient for ongoing care management follow up and provided patient with direct contact information for care management team Provided patient with written educational materials related to hypo and hyperglycemia and importance of correct treatment Advised patient, providing education and rationale, to check cbg daily and record, calling provider for findings outside established parameters Screening for signs and symptoms of depression related to chronic disease state  Assessed social determinant of health barriers Lab Results  Component Value Date   HGBA1C 6.8 (H) 06/02/2017    Patient Self-Care Activities:  schedule appointment with eye doctor check blood sugar at prescribed times: before meals and at bedtime check feet daily for cuts, sores or redness enter blood sugar readings and medication or insulin  into daily log take the blood sugar log to all doctor visits drink 6 to 8 glasses of water each day eat fish at least once per week fill half of plate with  vegetables limit fast food meals to no more than 1 per week manage portion size set a realistic goal switch to sugar-free drinks keep feet up while sitting wash and dry feet carefully every day wear comfortable, well-fitting shoes  Plan:  Telephone follow up appointment with care management team member scheduled for:  12-30-2023 at 9:45 AM              Please call the Suicide and Crisis Lifeline: 988 call the [place]  National Suicide Prevention Lifeline: (305)122-5876 or TTY: 9386680071 TTY (619)284-5322) to talk to a trained counselor call 1-800-273-TALK (toll free, 24 hour hotline) call the [facility]: [phone] call 911 if you are experiencing a Mental Health or Behavioral Health Crisis or need someone to talk to.  The patient verbalized understanding of instructions, educational materials, and care plan provided today and agreed to receive a mailed copy of patient instructions, educational materials, and care plan.   Hendricks Her RN, BSN  [place] [facility] VBCI-Population Health RN Case Information systems manager [phone]

## 2023-12-30 ENCOUNTER — Other Ambulatory Visit: Payer: Self-pay

## 2023-12-30 NOTE — Patient Instructions (Signed)
 Visit Information  Thank you for taking time to visit with me today. Please don't hesitate to contact me if I can be of assistance to you before our next scheduled appointment.  Your next care management appointment is by telephone on 02-01-2024 at 10:00 AM   Telephone follow-up in 1 month  Please call the care guide team at [phone] if you need to cancel, schedule, or reschedule an appointment.   Please call the Suicide and Crisis Lifeline: 988 call the [place]  National Suicide Prevention Lifeline: 539-249-8605 or TTY: 5803066628 TTY (218) 629-8900) to talk to a trained counselor call 1-800-273-TALK (toll free, 24 hour hotline) call the [facility]: 315-811-1297 call 911 if you are experiencing a Mental Health or Behavioral Health Crisis or need someone to talk to.  Hendricks Her RN, BSN  [place] [facility] VBCI-Population Health RN Case Information systems manager [phone]

## 2024-01-05 DIAGNOSIS — I509 Heart failure, unspecified: Secondary | ICD-10-CM | POA: Diagnosis not present

## 2024-01-05 DIAGNOSIS — J449 Chronic obstructive pulmonary disease, unspecified: Secondary | ICD-10-CM | POA: Diagnosis not present

## 2024-02-01 ENCOUNTER — Other Ambulatory Visit: Payer: Self-pay

## 2024-02-01 NOTE — Patient Instructions (Signed)
 Visit Information  Thank you for taking time to visit with me today. Please don't hesitate to contact me if I can be of assistance to you before our next scheduled appointment.  Your next care management appointment is by telephone on 03-02-2024 at 10:00 AM   Telephone follow-up in 1 month  Please call the care guide team at [phone] if you need to cancel, schedule, or reschedule an appointment.   Please call the Suicide and Crisis Lifeline: 988 call the [place]  National Suicide Prevention Lifeline: 682-390-8172 or TTY: (801) 455-4322 TTY 810-369-5944) to talk to a trained counselor call 1-800-273-TALK (toll free, 24 hour hotline) call the [facility]: [phone] if you are experiencing a Mental Health or Behavioral Health Crisis or need someone to talk to.  Hendricks Her RN, BSN  [place] [facility] VBCI-Population Health RN Case Information Systems Manager [phone]

## 2024-02-05 DIAGNOSIS — J449 Chronic obstructive pulmonary disease, unspecified: Secondary | ICD-10-CM | POA: Diagnosis not present

## 2024-03-01 NOTE — Patient Outreach (Signed)
 Complex Care Management   Visit Note  03/01/2024  Name:  Katherine Romero MRN: [id number] DOB: 23-Aug-1950  Situation: Referral received for Complex Care Management related to COPD I obtained verbal consent from Patient.  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 Tamea, [facility]   Arthritis    Atypical chest pain 09/25/2015   Chronic diastolic (congestive) heart failure 08/29/2020   COPD (chronic obstructive pulmonary disease) 09/25/2015   Elliptocytosis 07/19/2015   Essential (primary) hypertension 11/04/2018   Generalized anxiety disorder 09/25/2015   GERD (gastroesophageal reflux disease)    Heme positive stool 09/14/2015   History of COVID-19 08/25/2020   Hypercholesteremia    Hyperlipidemia    Hypokalemia 08/26/2020   Iron deficiency anemia 08/10/2015   Major depressive disorder 10/14/2017   Nonrheumatic aortic valve stenosis 04/28/2019   Pneumonia due to COVID-19 virus 08/25/2020   PTSD (post-traumatic stress disorder)    Shortness of breath 11/04/2018   Tobacco use 07/19/2015   Type II diabetes mellitus     Assessment: Patient Reported Symptoms:  Cognitive Cognitive Status: Alert and oriented to person, place, and time, Normal speech and language skills   Health Maintenance Behaviors: Annual physical exam Healing Pattern: Average Health Facilitated by: Rest  Neurological Neurological Review of Symptoms: No symptoms reported, Vision changes Neurological Management Strategies: Coping strategies, Routine screening Neurological Self-Management Outcome: 3 (uncertain) Neurological Comment: Ma'am I'm going Blind-  eye Dr requesting patient  come in  [facility] will make an appointment  HEENT HEENT Symptoms Reported: Sudden change or loss of vision, Eye dryness HEENT Management  Strategies: Coping strategies, Routine screening HEENT Self-Management Outcome: 4 (good) HEENT Comment: macular degeneration    Cardiovascular Cardiovascular Symptoms Reported: Fatigue Does patient have uncontrolled Hypertension?: No Cardiovascular Management Strategies: Coping strategies, Routine screening Cardiovascular Self-Management Outcome: 4 (good)  Respiratory Respiratory Symptoms Reported: Productive cough Additional Respiratory Details: recent Upper respiratory - Z PAK; phlegm- emphysema Respiratory Management Strategies: Oxygen  therapy, Coping strategies, Adequate rest, Routine screening Respiratory Self-Management Outcome: 4 (good)  Endocrine Is patient diabetic?: Yes Is patient checking blood sugars at home?: No (Asking Son to help) Endocrine Self-Management Outcome: 3 (uncertain)  Gastrointestinal Gastrointestinal Symptoms Reported: Reflux/heartburn, Bleeding Additional Gastrointestinal Details: Son stated her stools were black- Patient to discuss with Dr Loreli this Wednesday Gastrointestinal Management Strategies: Coping strategies Gastrointestinal Self-Management Outcome: 3 (uncertain)    Genitourinary Genitourinary Symptoms Reported: Frequency Genitourinary Management Strategies: Coping strategies, Incontinence garment/pad Genitourinary Self-Management Outcome: 4 (good)  Integumentary Integumentary Symptoms Reported: Incision Additional Integumentary Details: All over- Dry skin  Encouraged to moisturize daily Skin Management Strategies: Coping strategies, Routine screening Skin Self-Management Outcome: 4 (good) Skin Comment: going to ask Dr Loreli for a recommendation on moisturizer  Musculoskeletal Musculoskelatal Symptoms Reviewed: Limited mobility Additional Musculoskeletal Details: leg proble  Sometimes hurts to walk Musculoskeletal Management Strategies: Coping strategies, Routine screening Musculoskeletal Self-Management Outcome: 4 (good) Number of falls in past  year: 1 or less Was there an injury with Fall?: No Fall risk Follow up: Falls evaluation completed, Education provided  Psychosocial Psychosocial Symptoms Reported: No symptoms reported Behavioral Management Strategies: Adequate rest, Coping strategies Behavioral Health Comment: Worship Song- Don't Let the Light in me Die  Tess Swims  Loves this and listens Techniques to [employer] with Loss/Stress/Change: Spiritual practice(s), Diversional activities Quality of  Family Relationships: helpful, involved, supportive    03/01/2024    PHQ2-9 Depression Screening   Little interest or pleasure in doing things Not at all  Feeling down, depressed, or hopeless Several days  PHQ-2 - Total Score 1  Trouble falling or staying asleep, or sleeping too much    Feeling tired or having little energy    Poor appetite or overeating     Feeling bad about yourself - or that you are a failure or have let yourself or your family down    Trouble concentrating on things, such as reading the newspaper or watching television    Moving or speaking so slowly that other people could have noticed.  Or the opposite - being so fidgety or restless that you have been moving around a lot more than usual    Thoughts that you would be better off dead, or hurting yourself in some way    PHQ2-9 Total Score    If you checked off any problems, how difficult have these problems made it for you to do your work, take care of things at home, or get along with other people    Depression Interventions/Treatment      Today's Vitals   Pain Scale: 0-10 Pain Score: 0-No pain  Medications Reviewed Today     Reviewed by Kay Hendricks MATSU, RN (Case Manager) on 02/01/24 at 1013  Med List Status: <None>   Medication Order Taking? Sig Documenting Provider Last Dose Status Informant  albuterol  (PROVENTIL ) (2.5 MG/3ML) 0.083% nebulizer solution [id number] Yes INHALE ONE VIAL IN NEBULIZER EVERY 6 HOURS AS NEEDED FOR [facility] OF BREATH  [provider]  Active Self           Med Note TONIANN, DEBBIE G   Thu Jul 19, 2015 11:25 AM) Received from: External Pharmacy  Albuterol  Sulfate (PROAIR  RESPICLICK) 108 (90 Base) MCG/ACT AEPB [id number] Yes Inhale 2 puffs into the lungs every 6 (six) hours as needed (for wheezing/shortness of breath).  [provider]  Active Self           Med Note GLORIETTA DELON LOISE [place] May 21, 2017  3:00 PM)    ALPRAZolam (XANAX) 0.5 MG tablet [id number] Yes Take 0.5 mg by mouth at bedtime as needed for anxiety. [provider]  Active   amLODipine  (NORVASC ) 10 MG tablet [id number] Yes Take 1 tablet by mouth daily. [provider]  Active   ARIPiprazole  (ABILIFY ) 15 MG tablet [id number]  Take 1 tablet (15 mg total) by mouth daily.  Patient not taking: Reported on 02/01/2024   Vickey Mettle, MD  Expired 09/29/22 2359   aspirin  81 MG EC tablet [id number]  Take by mouth.  Patient not taking: Reported on 02/01/2024   [provider]  Active   Aspirin -Salicylamide-Caffeine (BC HEADACHE POWDER PO) [id number] Yes Take 1 Package by mouth every 4 (four) hours as needed (PAIN). [provider]  Active Self  atorvastatin  (LIPITOR) 40 MG tablet [id number] Yes Take 40 mg by mouth daily. [provider]  Active Self  BREZTRI AEROSPHERE 160-9-4.8 MCG/ACT AERO inhaler [id number] Yes Inhale 2 puffs into the lungs in the morning and at bedtime. [provider]  Active   budesonide -formoterol (SYMBICORT) 160-4.5 MCG/ACT inhaler [id number]  Inhale 2 puffs into the lungs 2 (two) times daily.  Patient not taking: Reported on 02/01/2024   [provider]  Consider Medication Status and Discontinue            Med  Note SCHUYLER JOSETTE SAILOR   Fri Aug 28, 2023  3:59 PM) Changed to [facility] PLUS (ENSURE PLUS) BERNICE [id number] Yes Take 237 mLs by mouth 2 (two) times daily between meals. [provider]  Active Self  FEROSUL 325 (65 Fe) MG tablet  [id number] Yes Take 325 mg by mouth daily. [provider]  Active   furosemide (LASIX) 20 MG tablet [id number] Yes Take 20 mg by mouth 2 (two) times daily. [provider]  Active   gabapentin (NEURONTIN) 100 MG capsule [id number] Yes Take 100 mg by mouth 3 (three) times daily.  Patient taking differently: Take 100 mg by mouth as needed.   [provider]  Active   gemfibrozil (LOPID) 600 MG tablet [id number] Yes Take 600 mg by mouth 2 (two) times daily. [provider]  Active   hydrochlorothiazide (HYDRODIURIL) 25 MG tablet [id number] Yes Take 1 tablet by mouth daily. [provider]  Active   JARDIANCE 10 MG TABS tablet [id number] Yes Take 25 mg by mouth daily. [provider]  Active   metFORMIN (GLUCOPHAGE) 500 MG tablet [id number] Yes Take 1 tablet by mouth daily. [provider]  Active   metoprolol  (LOPRESSOR ) 100 MG tablet [id number]  Take 100 mg by mouth 2 (two) times daily.  Patient not taking: Reported on 02/01/2024   [provider]  Consider Medication Status and Discontinue Self           Med Note SCHUYLER JOSETTE SAILOR   Fri Aug 28, 2023  4:03 PM)    metoprolol  succinate (TOPROL -XL) 25 MG 24 hr tablet [id number] Yes Take 25 mg by mouth 2 (two) times daily. [provider]  Active   mirtazapine  (REMERON ) 7.5 MG tablet [id number]  Take 1 tablet (7.5 mg total) by mouth at bedtime.  Patient not taking: Reported on 02/01/2024   Vickey Mettle, MD  Expired 09/29/22 2359   montelukast (SINGULAIR) 10 MG tablet [id number]  Take 10 mg by mouth at bedtime.  Patient not taking: Reported on 02/01/2024   [provider]  Consider Medication Status and Discontinue   pantoprazole  (PROTONIX ) 40 MG tablet [id number] Yes Take 40 mg by mouth daily. [provider]  Active   sertraline  (ZOLOFT ) 100 MG tablet [id number]  Take 1.5 tablets (150 mg total) by mouth daily.  Patient not taking: Reported on 02/01/2024   Eappen,  Saramma, MD  Expired 07/22/23 2359   sertraline  (ZOLOFT ) 50 MG tablet [id number] Yes Take 50 mg by mouth daily. [provider]  Active   traZODone  (DESYREL ) 50 MG tablet [id number]  Take 0.5-1 tablets (25-50 mg total) by mouth at bedtime as needed for sleep.  Patient not taking: Reported on 02/01/2024   Vickey Mettle, MD  Expired 02/24/22 2359             Recommendation:   Continue Current Plan of Care  Follow Up Plan:   Telephone follow-up in 1 month  Hendricks Her RN, BSN  [place] [facility] RN Case Manager   Direct [phone]

## 2024-03-02 ENCOUNTER — Telehealth: Payer: Self-pay

## 2024-03-02 NOTE — Patient Instructions (Signed)
 Katherine Romero - I am sorry I was unable to reach you today for our scheduled appointment. I work with Maree Isles, MD and am calling to support your healthcare needs. Please contact me at [phone] at your earliest convenience. I look forward to speaking with you soon.   Thank you,  Hendricks Her RN, BSN  [place] [facility] V[facility]-Population Health RN Case Manager   Direct [phone]

## 2024-03-06 DIAGNOSIS — J449 Chronic obstructive pulmonary disease, unspecified: Secondary | ICD-10-CM | POA: Diagnosis not present

## 2024-03-06 DIAGNOSIS — I509 Heart failure, unspecified: Secondary | ICD-10-CM | POA: Diagnosis not present

## 2024-05-20 ENCOUNTER — Ambulatory Visit: Admitting: Internal Medicine
